# Patient Record
Sex: Male | Born: 1970 | Race: White | Hispanic: No | Marital: Married | Smoking: Former smoker
Health system: Southern US, Community
[De-identification: ages and names within clinical notes are randomized; demographics above are authoritative.]

## PROBLEM LIST (undated history)

## (undated) DIAGNOSIS — I10 Essential (primary) hypertension: Secondary | ICD-10-CM

## (undated) DIAGNOSIS — K5790 Diverticulosis of intestine, part unspecified, without perforation or abscess without bleeding: Secondary | ICD-10-CM

## (undated) DIAGNOSIS — E785 Hyperlipidemia, unspecified: Secondary | ICD-10-CM

## (undated) DIAGNOSIS — K219 Gastro-esophageal reflux disease without esophagitis: Secondary | ICD-10-CM

## (undated) DIAGNOSIS — G4733 Obstructive sleep apnea (adult) (pediatric): Secondary | ICD-10-CM

## (undated) DIAGNOSIS — R9430 Abnormal result of cardiovascular function study, unspecified: Secondary | ICD-10-CM

## (undated) DIAGNOSIS — E119 Type 2 diabetes mellitus without complications: Secondary | ICD-10-CM

## (undated) DIAGNOSIS — IMO0002 Reserved for concepts with insufficient information to code with codable children: Secondary | ICD-10-CM

## (undated) DIAGNOSIS — IMO0001 Reserved for inherently not codable concepts without codable children: Secondary | ICD-10-CM

## (undated) HISTORY — DX: Reserved for inherently not codable concepts without codable children: IMO0001

## (undated) HISTORY — DX: Hyperlipidemia, unspecified: E78.5

## (undated) HISTORY — DX: Reserved for concepts with insufficient information to code with codable children: IMO0002

## (undated) HISTORY — DX: Obstructive sleep apnea (adult) (pediatric): G47.33

## (undated) HISTORY — DX: Essential (primary) hypertension: I10

## (undated) HISTORY — DX: Gastro-esophageal reflux disease without esophagitis: K21.9

## (undated) HISTORY — DX: Abnormal result of cardiovascular function study, unspecified: R94.30

## (undated) HISTORY — DX: Type 2 diabetes mellitus without complications: E11.9

## (undated) HISTORY — DX: Diverticulosis of intestine, part unspecified, without perforation or abscess without bleeding: K57.90

---

## 1999-08-03 ENCOUNTER — Emergency Department (HOSPITAL_COMMUNITY): Admission: EM | Admit: 1999-08-03 | Discharge: 1999-08-03 | Payer: Self-pay | Admitting: Emergency Medicine

## 1999-08-03 ENCOUNTER — Encounter: Payer: Self-pay | Admitting: Emergency Medicine

## 1999-08-07 ENCOUNTER — Inpatient Hospital Stay (HOSPITAL_COMMUNITY): Admission: EM | Admit: 1999-08-07 | Discharge: 1999-08-09 | Payer: Self-pay | Admitting: Emergency Medicine

## 2002-10-08 ENCOUNTER — Emergency Department (HOSPITAL_COMMUNITY): Admission: EM | Admit: 2002-10-08 | Discharge: 2002-10-09 | Payer: Self-pay | Admitting: Emergency Medicine

## 2002-10-08 ENCOUNTER — Encounter: Payer: Self-pay | Admitting: Emergency Medicine

## 2003-07-23 ENCOUNTER — Inpatient Hospital Stay (HOSPITAL_COMMUNITY): Admission: EM | Admit: 2003-07-23 | Discharge: 2003-07-29 | Payer: Self-pay | Admitting: Internal Medicine

## 2004-02-12 ENCOUNTER — Encounter: Admission: RE | Admit: 2004-02-12 | Discharge: 2004-02-12 | Payer: Self-pay | Admitting: General Surgery

## 2004-02-20 ENCOUNTER — Ambulatory Visit (HOSPITAL_COMMUNITY): Admission: RE | Admit: 2004-02-20 | Discharge: 2004-02-20 | Payer: Self-pay | Admitting: Gastroenterology

## 2004-02-21 ENCOUNTER — Ambulatory Visit (HOSPITAL_COMMUNITY): Admission: RE | Admit: 2004-02-21 | Discharge: 2004-02-21 | Payer: Self-pay | Admitting: Gastroenterology

## 2004-05-28 ENCOUNTER — Inpatient Hospital Stay (HOSPITAL_COMMUNITY): Admission: EM | Admit: 2004-05-28 | Discharge: 2004-06-03 | Payer: Self-pay | Admitting: Gastroenterology

## 2004-08-25 ENCOUNTER — Ambulatory Visit: Payer: Self-pay | Admitting: Gastroenterology

## 2004-09-09 ENCOUNTER — Ambulatory Visit: Payer: Self-pay | Admitting: Family Medicine

## 2004-09-30 ENCOUNTER — Ambulatory Visit (HOSPITAL_COMMUNITY): Admission: RE | Admit: 2004-09-30 | Discharge: 2004-09-30 | Payer: Self-pay | Admitting: *Deleted

## 2004-11-03 ENCOUNTER — Ambulatory Visit (HOSPITAL_COMMUNITY): Admission: RE | Admit: 2004-11-03 | Discharge: 2004-11-03 | Payer: Self-pay | Admitting: *Deleted

## 2005-10-19 ENCOUNTER — Ambulatory Visit: Payer: Self-pay | Admitting: Family Medicine

## 2005-11-05 ENCOUNTER — Ambulatory Visit (HOSPITAL_COMMUNITY): Admission: RE | Admit: 2005-11-05 | Discharge: 2005-11-05 | Payer: Self-pay | Admitting: General Surgery

## 2005-11-05 HISTORY — PX: INGUINAL HERNIA REPAIR: SUR1180

## 2007-04-26 ENCOUNTER — Encounter: Admission: RE | Admit: 2007-04-26 | Discharge: 2007-04-26 | Payer: Self-pay | Admitting: Family Medicine

## 2007-06-17 ENCOUNTER — Encounter: Admission: RE | Admit: 2007-06-17 | Discharge: 2007-06-17 | Payer: Self-pay | Admitting: General Surgery

## 2007-07-28 ENCOUNTER — Encounter: Admission: RE | Admit: 2007-07-28 | Discharge: 2007-07-28 | Payer: Self-pay | Admitting: Family Medicine

## 2009-08-29 ENCOUNTER — Telehealth (INDEPENDENT_AMBULATORY_CARE_PROVIDER_SITE_OTHER): Payer: Self-pay | Admitting: *Deleted

## 2011-02-05 NOTE — Discharge Summary (Signed)
NAMEJERMINE, Jonathan Mcfarland NO.:  [id number]   MEDICAL RECORD NO.:  [id number]                   PATIENT TYPE:  INP   LOCATION:  0445                                 FACILITY:  [facility]   PHYSICIAN:  Barbette Hair. Jonathan Mcfarland, M.D. [facility]          DATE OF BIRTH:  11-Aug-1971   DATE OF ADMISSION:  05/28/2004  DATE OF DISCHARGE:  06/03/2004                                 DISCHARGE SUMMARY   ADMITTING DIAGNOSES:  31.  A 40 year old white male with progressive abdominal pain and diarrhea      with colonoscopy findings suggestive of Crohn's colitis, symptomatically      refractory to outpatient management.  2.  Gastroesophageal reflux disease.   DISCHARGE DIAGNOSES:  1.  Crohn's colitis refractory to outpatient management, symptomatically      improved.  2.  Leukocytosis felt secondary to steroids.  3.  Gastroesophageal reflux disease.   CONSULTATIONS:  None.   PROCEDURES:  CT scan of the abdomen and pelvis.   BRIEF HISTORY:  Jonathan Mcfarland is a pleasant 40 year old white male, who was  admitted on the day of colonoscopy for management of progressive abdominal  pain and diarrhea.  He initially had presented to Dr. Arlyce Mcfarland in November  2004 with similar but less severe symptoms.  It was thought at that time  that he had diverticulitis.  CT scan showed some low-grade diverticulitis  and stranding in the sigmoid colon.  He was then seen after that but  persisted with abdominal pain and started having some hematochezia.  He had  undergo colonoscopy, showed some erythema of the left colon, and biopsies  were done and were negative.  He was placed on Asacol 800 mg t.i.d.  He was  reevaluated in June 2005, again with persistent abdominal pain and diarrhea.  Colonoscopy was repeated showing erythema and edema in the left colon but  negative biopsies.  He was continued on Asacol and started on hydrocortisone  enemas.  He then had upper GI and small-bowel follow-through which was  unremarkable.  He continued to be symptomatic and was started on a trial of  prednisone, as it was felt that he likely did have inflammatory bowel  disease, though unable to prove on biopsies.  His diarrhea initially  improved on prednisone but on lowering his dose, his symptoms returned.  He  has had a difficult time since that and recently has been on increasing  doses of prednisone up to 40 per day.  His Asacol has been increased to 1200  mg t.i.d.  He underwent repeat colonoscopy on the day of admission which  showed edema of the left colon and mild erythema.  He had three discrete  ulcers noted in the rectosigmoid junction, and it was felt that his findings  were consistent with Crohn's.  Because he was refractory to outpatient  management at this time, he is admitted for bowel rest and IV steroids.   LABORATORY STUDIES  ON ADMISSION:  IBD markers were sent and were pending at  the time of dictation.  WBC 11.5, hemoglobin 14.6, hematocrit 43, MCV 84,  sed rate 9, pro time 12.2, INR 0.9; electrolytes were within normal limits,  albumin 3.5.  Liver function studies normal with the exception of an SGPT of  56.  Repeat CBC on September 12 showed a white count of 19.1 with no  significant shift, hemoglobin 16.4, hematocrit 49.1.   CT scan of the abdomen and pelvis on September 9, unremarkable CT of the  abdomen.  Pelvis showed wall thickening of the sigmoid colon, more prominent  then on his prior CT and subtle pericolonic stranding around the involved  segments.  There was no evidence of free air or abscess.  Appendix was  unremarkable.  His terminal ileum was decompressed but no evidence of  inflammatory change noted.   HOSPITAL COURSE:  The patient was admitted to the service of Dr. Melvia Heaps.  He was initially placed on bowel rest, started on IV Solu-Medrol at  30 q.12h., given Phenergan and Demerol as needed for pain control and  nausea.  He was also placed on IV Protonix, p.o.  Bentyl, and we started him  on Imuran at 75 mg p.o. daily, as he has been somewhat refractory to oral  steroids.  He underwent CT scan of the abdomen and pelvis on the 9th with  findings as outlined above.  We gradually advanced his diet, continued him  on IV Solu-Medrol, and he continued to require IV narcotics for several  days.  He did gradually improve, and we were able to convert him to oral  pain medication and get his steroids converted to oral form as well.  His  diarrhea decreased significantly, and actually he did not really have any  bowel movements for a couple of days prior to discharge.  He did not have  any ongoing rectal bleeding after the first 24 hours either.  He was felt  stable for discharge on September 14 with instructions to follow up with Dr.  Arlyce Mcfarland in the office on September 19 and at that time if he is doing well,  will be released back to work.   DISCHARGE MEDICATIONS:  1.  Asacol 400 mg 4 p.o. t.i.d.  2.  Prednisone 60 mg p.o. q.a.m.  3.  Protonix 40 mg daily.  4.  Tranxene 7.5 b.i.d. to t.i.d. p.r.n.  5.  Mepergan Fortis 1 p.o. q.6h. p.r.n. pain.  6.  He was asked to try to use Extra-Strength Tylenol for less severe pain.  7.  Imuran 75 mg daily.   DIET:  Low residue.   We will need to follow up on his IBD markers; his biopsies from recent  colonoscopy do not show any active inflammation.  Also, he will need follow  up liver function studies and CBC because of initiating Imuran during his  hospitalization.      AE/MEDQ  D:  06/05/2004  T:  06/06/2004  Job:  [id number]

## 2011-02-05 NOTE — Op Note (Signed)
NAMERAYANE, GALLARDO NO.:  [id number]   MEDICAL RECORD NO.:  [id number]          PATIENT TYPE:  AMB   LOCATION:  DAY                          FACILITY:  [facility]   PHYSICIAN:  Timothy E. Earlene Plater, M.D. DATE OF BIRTH:  1970/12/22   DATE OF PROCEDURE:  11/05/2005  DATE OF DISCHARGE:                                 OPERATIVE REPORT   PREOPERATIVE DIAGNOSIS:  Left inguinal hernia.   POSTOPERATIVE DIAGNOSIS:  Direct left inguinal hernia.   OPERATIVE PROCEDURE:  Repair of left direct inguinal hernia with mesh.   SURGEON:  Timothy E. Earlene Plater, M.D.   ANESTHESIA:  General.   Mr. Jonathan Mcfarland is well known to me, with the resolution of other GI issues.  A  symptomatic left inguinal hernia is present, documented, and he wishes to  have a repair, and he does agree and understand.  The area was marked and  the permit signed.   The patient was taken to the operating room and placed supine.  General  endotracheal anesthesia administered.  The groin was clipped and prepped and  draped in the usual fashion.  Generous 0.25% Marcaine with epinephrine was  used throughout for prolonged local anesthesia.  Short curvilinear incision  made in a skin crease in the left groin, the abundant subcutaneous tissue  dissected, and bleeding points cauterized, and the external oblique  identified and opened in line with its fibers to the external ring.  Cord  structures were small and normal.  They were dissected from the floor of the  canal which was completely herniated.  All structures were dissected out.  The ilioinguinal nerve was identified and spared.  A piece of ULTRAPRO mesh  was cut and fashioned to fit the floor of the canal and sewn down to the  surrounding normal fascia with 2-0 Prolene up to and around the internal  ring and the cord structures exited normally.  The repair was satisfactory,  and hernia contents were reduced, and again the cord was normal.  The  external oblique was  closed with a running 2-0 Vicryl, deep subcutaneous  with 2-0 Vicryl, and skin with 3-0 Monocryl.  Counts were correct.  Steri-  Strips applied.  He was removed to the recovery room in good condition.   Written and verbal instructions including Percocet were given.  He will be  seen in followup in the office.      Timothy E. Earlene Plater, M.D.  Electronically Signed     TED/MEDQ  D:  11/05/2005  T:  11/05/2005  Job:  [id number]

## 2011-02-05 NOTE — H&P (Signed)
NAMEHEWITT, GARNER NO.:  [id number]   MEDICAL RECORD NO.:  [id number]                   PATIENT TYPE:  INP   LOCATION:  0445                                 FACILITY:  [facility]   PHYSICIAN:  Barbette Hair. Arlyce Dice, M.D. [facility]          DATE OF BIRTH:  1970-11-18   DATE OF ADMISSION:  05/28/2004  DATE OF DISCHARGE:                                HISTORY & PHYSICAL   PROBLEM:  Abdominal pain and diarrhea.   HISTORY OF PRESENT ILLNESS:  The patient is a 40 year old white male  admitted for diarrhea and abdominal pain.  He presented in November of 2004  with a similar presentation.  It was thought that he had acute  diverticulosis.  CT demonstrated diverticulosis and straining in the sigmoid  colon.  He was subsequently seen as an outpatient for persistent abdominal  pain, diarrhea, and bleeding.  He underwent a colonoscopy in November of  2004 that demonstrated some erythema of the left colon.  Biopsies were  negative.  He was nonetheless placed on Asacol 800 mg t.i.d.  He was  reevaluated in June for persistent abdominal pain and diarrhea.  Colonoscopy  at that time demonstrated erythema and edema of the left colon.  Biopsies  again were negative.  He was maintained on Asacol and was given  hydrocortisone enemas.  An upper GI and small bowel follow through was  performed in June of 2005 that was entirely unremarkable.  In late June, he  was placed on prednisone 20 mg a day.  Diarrhea subsided.  Upon lowering his  prednisone from 20 to 15 mg he had recurrent diarrhea and was increased to  20 mg.  Prednisone was slowly lowered.  Again in August, he had recurrence  of his pain and diarrhea.  Over the last two to three weeks, he has had  persistent symptoms despite 40 mg of prednisone.  Asacol was increased to  1500 mg t.i.d.  Today, he underwent repeat colonoscopy which demonstrated  edema of the left colon with very mild erythema.  Three discrete ulcers were  seen  in the rectosigmoid junction.  Findings are compatible with Crohn's  disease.  He is admitted for bowel rest and steroids.   PAST MEDICAL HISTORY:  Unremarkable.   MEDICATIONS:  1.  Protonix 40 mg a day.  2.  Prednisone 40 mg daily.  3.  Asacol 1600 mg t.i.d.   ALLERGIES:  FLAGYL, CIPRO, MORPHINE.   PHYSICAL EXAMINATION:  GENERAL:  He is a well-developed, well-nourished,  male.  VITAL SIGNS:  Pulse 74, blood pressure 140/98, weight 218.  HEENT:  Head is within normal limits.  CHEST:  Clear.  There are no cardiac murmurs, rubs, or gallops.  ABDOMEN:  Without mass, tenderness, or organomegaly.  RECTAL:  Deferred.   IMPRESSION:  Abdominal pain and diarrhea.  Colonoscopy findings are very  suggestive of Crohn's colitis.   RECOMMENDATIONS:  1.  Bowel rest.  2.  Solu-Medrol 30 mg b.i.d.  3.  Continue Asacol 1600 mg t.i.d.  4.  CT of the abdomen and pelvis.                                                Barbette Hair. Arlyce Dice, M.D. [facility]    RDK/MEDQ  D:  05/28/2004  T:  05/28/2004  Job:  [id number]   cc:   Sheppard Plumber. Earlene Plater, M.D.  [address] [place]  [place] [address]  Fax: [phone]

## 2011-02-05 NOTE — Discharge Summary (Signed)
NAMEZAYAN, DELVECCHIO NO.:  [id number]   MEDICAL RECORD NO.:  [id number]                   PATIENT TYPE:  INP   LOCATION:  0344                                 FACILITY:  [facility]   PHYSICIAN:  Rene Paci, M.D. [facility]          DATE OF BIRTH:  1970-11-08   DATE OF ADMISSION:  07/23/2003  DATE OF DISCHARGE:  07/29/2003                                 DISCHARGE SUMMARY   DISCHARGE DIAGNOSES:  1. Abdominal pain and diarrhea secondary to diverticulitis.  Continue oral     antibiotics.  Outpatient follow-up with GI, Barbette Hair. Arlyce Dice, M.D. [facility]     November 29 at 9:30 to schedule for colonoscopy.  2. Chronic diarrhea exacerbation secondary to above.  Work-up with     outpatient as described.  3. Allergic reaction to Cipro, Flagyl, and/or morphine with itching,     resolved with discontinuation of these medications.   DISCHARGE MEDICATIONS:  1. Augmentin 875 mg p.o. b.i.d. x10 additional days.  2. Robinul one p.o. q.a.m. p.r.n. abdominal cramping.  3. Ibuprofen p.r.n. pain.   FOLLOWUP:  Hospital follow-up with GI, Barbette Hair. Arlyce Dice, M.D. [facility] as  described above November 29 at 9:30 a.m. to schedule for colonoscopy to  evaluate chronic diarrhea.  Also follow up with Laurita Quint, M.D.,  primary care physician as needed.   HOSPITAL COURSE:  Problem 1 - ABDOMINAL PAIN WITH DIARRHEA SECONDARY TO  DIVERTICULITIS:  The patient is a pleasant 40 year old gentleman who  presented to his primary care physician's office day of admission secondary  to intense abdominal pain that had awoken him from sleep and was associated  with an exacerbation of his chronic diarrhea.  Due to concerns for acute  abdomen he was referred for admission.  CT of the abdomen and pelvis was  performed and showed diverticulosis of the sigmoid colon with stranding  compatible with diverticulitis.  No evidence of abscess, free fluid.  Normal  appendix.  Therefore, he was initiated on IV  antibiotics Flagyl and Cipro,  though he developed itching on these medications.  He was changed then to  Unasyn for a day and then tolerated p.o. Augmentin without difficulty.  The  itching resolved with discontinuation of these medications as well as the  morphine which he had been given for pain.  His white count has returned to  normal prior to discharge and his stooling is less than b.i.d.  Outpatient  follow-up as described above with GI for evaluation of frequent stooling.                                               Rene Paci, M.D. [facility]    VL/MEDQ  D:  08/09/2003  T:  08/09/2003  Job:  [id number]

## 2011-02-28 ENCOUNTER — Encounter: Payer: Self-pay | Admitting: Physician Assistant

## 2011-04-19 ENCOUNTER — Encounter: Payer: Self-pay | Admitting: Physician Assistant

## 2011-04-20 ENCOUNTER — Encounter: Payer: Self-pay | Admitting: Physician Assistant

## 2011-04-20 ENCOUNTER — Ambulatory Visit (INDEPENDENT_AMBULATORY_CARE_PROVIDER_SITE_OTHER): Payer: 59 | Admitting: Physician Assistant

## 2011-04-20 ENCOUNTER — Ambulatory Visit (HOSPITAL_COMMUNITY): Payer: 59 | Attending: Physician Assistant | Admitting: Radiology

## 2011-04-20 DIAGNOSIS — I10 Essential (primary) hypertension: Secondary | ICD-10-CM | POA: Insufficient documentation

## 2011-04-20 DIAGNOSIS — I509 Heart failure, unspecified: Secondary | ICD-10-CM | POA: Insufficient documentation

## 2011-04-20 DIAGNOSIS — I4891 Unspecified atrial fibrillation: Secondary | ICD-10-CM | POA: Insufficient documentation

## 2011-04-20 DIAGNOSIS — R079 Chest pain, unspecified: Secondary | ICD-10-CM

## 2011-04-20 NOTE — Progress Notes (Signed)
Exercise Treadmill Test  Pre-Exercise Testing Evaluation Rhythm: normal sinus  Rate: 76   PR:  .15 QRS:  .09  QT:  .35 QTc: .40     Test  Exercise Tolerance Test Ordering MD: Burnell Blanks M.D  Interpreting MD:  Tereso Newcomer PA-C  Unique Test No: 1  Treadmill:  1  Indication for ETT: chest pain - rule out ischemia  Contraindication to ETT: No   Stress Modality: exercise - treadmill  Cardiac Imaging Performed: non   Protocol: standard Bruce - submaximal  Max BP: 178/82  Max MPHR (bpm):  178 85% MPR (bpm):  151  MPHR obtained (bpm):  120 % MPHR obtained:  67  Reached 85% MPHR (min:sec):   Total Exercise Time (min-sec):  2:20  Workload in METS:  5.0 Borg Scale:   Reason ETT Terminated:  patient's desire to stop; Dyspnea    ST Segment Analysis At Rest: normal ST segments - no evidence of significant ST depression With Exercise: no evidence of significant ST depression  Other Information Arrhythmia:  No Angina during ETT:  absent (0) Quality of ETT:  non-diagnostic  ETT Interpretation:  normal - no evidence of ischemia by ST analysis  Comments: Poor exercise tolerance. Patient stopped due to significant dyspnea and near syncope. O2 sats checked after he came off treadmill and it was 94%, then 98%. He denied chest pain. BP did increase with exercise. No ST-T changes to suggest ischemia at submaximal exercise.  Recommendations: Patient has significant CRFs of HTN, HLP and prior smoking.  His maternal uncle (who is his adoptive father) had an MI in his 63s.  He is unsure of the rest of his history. He has had exertional chest pain and dyspnea for the past 2 months.   I discussed his case today with Dr. Ladona Ridgel and reviewed his stress test. I will set him up for an echo today to evaluate his LV function. I will also set him up for a Lexiscan Myoview this week. He will be brought back for consultation with one of our cardiologists in the next week after the above testing is  complete to make further recommendations.

## 2011-04-20 NOTE — Progress Notes (Deleted)
Exercise Treadmill Test  Pre-Exercise Testing Evaluation Rhythm: normal sinus  Rate: 76   PR:  .15 QRS:  .09  QT:  .35 QTc: .40     Test  Exercise Tolerance Test Ordering MD: Burnell Blanks M.D  Interpreting MD:  Tereso Newcomer PA-C  Unique Test No: 1  Treadmill:  1  Indication for ETT: chest pain - rule out ischemia  Contraindication to ETT: No   Stress Modality: exercise - treadmill  Cardiac Imaging Performed: non   Protocol: standard Bruce - submaximal  Max BP:  ***/***  Max MPHR (bpm):  178 85% MPR (bpm):  151  MPHR obtained (bpm):  *** % MPHR obtained:  ***  Reached 85% MPHR (min:sec):  *** Total Exercise Time (min-sec):  ***  Workload in METS:  *** Borg Scale: ***  Reason ETT Terminated:  {CHL REASON TERMINATED FOR ETT:21021064}    ST Segment Analysis At Rest: {CHL ST SEGMENT AT REST FOR ZOX:09604540} With Exercise: {CHL ST SEGMENT WITH EXERCISE FOR JWJ:19147829}  Other Information Arrhythmia:  {CHL ARRHYTHMIA FOR FAO:13086578} Angina during ETT:  {CHL ANGINA DURING ION:62952841} Quality of ETT:  {CHL QUALITY OF LKG:40102725}  ETT Interpretation:  {CHL INTERPRETATION FOR DGU:44034742}  Comments: ***  Recommendations: ***

## 2011-04-20 NOTE — Patient Instructions (Signed)
Your physician has requested that you have a lexiscan myoview DX [phone], AS PER SCOTT WEAVER, PA-C THIS NEEDS TO BE DONE THIS WEEK. For further information please visit [email or web address] Please follow instruction sheet, as given.  Your physician recommends that you schedule a follow-up appointment in: PLEASE MAKE APPOINTMENT WITH THE DOD NEXT WEEK, THIS IS ALSO AS PER SCOTT WEAVER, PA-C.

## 2011-04-21 ENCOUNTER — Encounter: Payer: Self-pay | Admitting: *Deleted

## 2011-04-22 ENCOUNTER — Ambulatory Visit (HOSPITAL_COMMUNITY): Payer: 59 | Attending: Cardiology | Admitting: Radiology

## 2011-04-22 DIAGNOSIS — R079 Chest pain, unspecified: Secondary | ICD-10-CM

## 2011-04-22 MED ORDER — TECHNETIUM TC 99M TETROFOSMIN IV KIT
33.0000 | PACK | Freq: Once | INTRAVENOUS | Status: AC | PRN
  Administered 2011-04-22: 33 via INTRAVENOUS

## 2011-04-22 MED ORDER — REGADENOSON 0.4 MG/5ML IV SOLN
0.4000 mg | Freq: Once | INTRAVENOUS | Status: AC
  Administered 2011-04-22: 0.4 mg via INTRAVENOUS

## 2011-04-22 MED ORDER — TECHNETIUM TC 99M TETROFOSMIN IV KIT
11.0000 | PACK | Freq: Once | INTRAVENOUS | Status: AC | PRN
  Administered 2011-04-22: 11 via INTRAVENOUS

## 2011-04-22 NOTE — Progress Notes (Addendum)
[facility] SITE 3 NUCLEAR MED [address] [place] [address] [phone]  Cardiology Nuclear Med Jonathan Mcfarland is a 40 y.o. male [id number] Oct 03, 1970   Nuclear Med Background Indication for Stress Test:  Evaluation for Ischemia History:  04/20/11 Echo:EF=55-60%; 04/20/11 WNU:[id number]; no ST changes; h/o Atrial Fibrillation. Cardiac Risk Factors: Family History - CAD, History of Smoking, Hypertension, Lipids and Obesity  Symptoms:  Chest Pain with and without Exertion (last episode of chest discomfort was yesterday), Diaphoresis, DOE, Nausea and Vomiting   Nuclear Pre-Procedure Caffeine/Decaff Intake:  None NPO After: 8:30pm   Lungs:  Clear.  O2 sat 96% on RA. IV 0.9% NS with Angio Cath:  20g  IV Site: R Antecubital  IV Started by:  Bonnita Levan, RN  Chest Size (in):  44 Cup Size: n/a  Height: 5\' 10"  (1.778 m)  Weight:  242 lb (109.77 kg)  BMI:  Body mass index is 34.72 kg/(m^2). Tech Comments:  N/A    Nuclear Med Study 1 or 2 day study: 1 day  Stress Test Type:  Treadmill/Lexiscan  Reading MD: Olga Millers, MD  Order Authorizing Provider:  Tereso Newcomer, PA; Willa Rough, MD  Resting Radionuclide: Technetium 64m Tetrofosmin  Resting Radionuclide Dose: 11.0 mCi   Stress Radionuclide:  Technetium 38m Tetrofosmin  Stress Radionuclide Dose: 33.0 mCi           Stress Protocol Rest HR: 56 Stress HR: 107  Rest BP: 112/74 Stress BP: 152/77  Exercise Time (min): 2:00 METS: n/a   Predicted Max HR: 178 bpm % Max HR: 60.11 bpm Rate Pressure Product: 34742   Dose of Adenosine (mg):  n/a Dose of Lexiscan: 0.4 mg  Dose of Atropine (mg): n/a Dose of Dobutamine: n/a mcg/kg/min (at max HR)  Stress Test Technologist: Smiley Houseman, CMA-N  Nuclear Technologist:  Doyne Keel, CNMT     Rest Procedure:  Myocardial perfusion imaging was performed at rest 45 minutes following the intravenous administration of Technetium 19m Tetrofosmin.  Rest ECG: No  acute changes.  Stress Procedure:  The patient received IV Lexiscan 0.4 mg over 15-seconds with concurrent low level exercise and then Technetium 58m Tetrofosmin was injected at 30-seconds while the patient continued walking one more minute.  There were nonspecific T-wave changes with Lexiscan.  Quantitative spect images were obtained after a 45-minute delay.  Stress ECG: No significant ST segment change suggestive of ischemia.  QPS Raw Data Images:  Acquisition technically good; normal left ventricular size. Stress Images:  There is decreased uptake in the anterior wall. Rest Images:  Normal homogeneous uptake in all areas of the myocardium. Subtraction (SDS):  These findings are consistent with mild anterior ischemia. Transient Ischemic Dilatation (Normal <1.22):  1.10 Lung/Heart Ratio (Normal <0.45):  0.31  Quantitative Gated Spect Images QGS EDV:  107 ml QGS ESV:  48 ml QGS cine images:  NL LV Function; NL Wall Motion QGS EF: 55%  Impression Exercise Capacity:  Lexiscan with low level exercise. BP Response:  Normal blood pressure response. Clinical Symptoms:  No chest pain. ECG Impression:  No significant ST segment change suggestive of ischemia. Comparison with Prior Nuclear Study: No previous nuclear study performed  Overall Impression:  Abnormal stress nuclear study.   Olga Millers     04/23/11 Make sure patient knows about appt next week Make sure he is taking ASA If he has any worrisome symptoms over the weekend he should go to the ED Tereso Newcomer, PA-C

## 2011-04-23 NOTE — Progress Notes (Signed)
Mild anterior ischemia noted, per Dr. Jens Som. Nuclear report routed to [facility] and Dr. Narda Rutherford pt. appt. on 04/30/11). Maelin Kurkowski, Farris Has

## 2011-04-30 ENCOUNTER — Ambulatory Visit (INDEPENDENT_AMBULATORY_CARE_PROVIDER_SITE_OTHER): Payer: 59 | Admitting: Cardiology

## 2011-04-30 ENCOUNTER — Other Ambulatory Visit: Payer: Self-pay | Admitting: *Deleted

## 2011-04-30 ENCOUNTER — Encounter: Payer: Self-pay | Admitting: *Deleted

## 2011-04-30 DIAGNOSIS — Z01810 Encounter for preprocedural cardiovascular examination: Secondary | ICD-10-CM

## 2011-04-30 DIAGNOSIS — I10 Essential (primary) hypertension: Secondary | ICD-10-CM

## 2011-04-30 DIAGNOSIS — E785 Hyperlipidemia, unspecified: Secondary | ICD-10-CM | POA: Insufficient documentation

## 2011-04-30 DIAGNOSIS — J45909 Unspecified asthma, uncomplicated: Secondary | ICD-10-CM | POA: Insufficient documentation

## 2011-04-30 DIAGNOSIS — R079 Chest pain, unspecified: Secondary | ICD-10-CM | POA: Insufficient documentation

## 2011-04-30 DIAGNOSIS — R9430 Abnormal result of cardiovascular function study, unspecified: Secondary | ICD-10-CM | POA: Insufficient documentation

## 2011-04-30 DIAGNOSIS — K219 Gastro-esophageal reflux disease without esophagitis: Secondary | ICD-10-CM | POA: Insufficient documentation

## 2011-04-30 NOTE — Assessment & Plan Note (Signed)
At this point we need to workup the patient's chest pain further.  His nuclear study does suggest mild anterior ischemia.  He has risk factors for coronary disease.  I've had a very careful discussion with the patient and his wife about cardiac catheterization including risks and benefits.  He wants to proceed and this will be arranged.  At this point it is difficult to know for sure if his pain is from cardiac disease.  Catheterization will help determine this.

## 2011-04-30 NOTE — Patient Instructions (Addendum)
Your physician has requested that you have a cardiac catheterization. Cardiac catheterization is used to diagnose and/or treat various heart conditions. Doctors may recommend this procedure for a number of different reasons. The most common reason is to evaluate chest pain. Chest pain can be a symptom of coronary artery disease (CAD), and cardiac catheterization can show whether plaque is narrowing or blocking your heart's arteries. This procedure is also used to evaluate the valves, as well as measure the blood flow and oxygen levels in different parts of your heart. For further information please visit [email or web address] Please follow instruction sheet, as given.on 05/11/11. Instructions will on Monday 05/10/11 when you come for labs. Your physician recommends that you return for lab work in: [place], [facility] on 05/10/11. Ask for Hardin Negus for Pre-cath instructions when you come.Marland Kitchen

## 2011-04-30 NOTE — Assessment & Plan Note (Signed)
Blood pressure is being controlled.  No change in therapy.

## 2011-04-30 NOTE — Progress Notes (Signed)
HPI The patient is seen today as a new patient evaluation for the evaluation of chest discomfort.  Patient has a family history of coronary disease.  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  He has been having chest discomfort.  Recently he has had chest pain at various times.  He is not having classic exertional pain.  He did awake one evening with chest discomfort and shortness of breath and took him to the emergency room.  He was stable with this and was discharged with plans for outpatient followup.  He had a treadmill which showed some ST changes.  Therefore echo and nuclear stress were arranged.  The echo revealed an ejection fraction of 55-60%.  The nuclear scan was done with low-level exercise and a pharmacologic agent.  The study was read as showing some increased uptake in the anterior wall.  It was read as consistent with mild anterior ischemia.  Ejection fraction was 55%.   Today the patient is here with his wife.  He is stable.  Allergies  Allergen Reactions  . Ciprofloxacin   . Percocet (Oxycodone-Acetaminophen)     Current Outpatient Prescriptions  Medication Sig Dispense Refill  . DOXAZOSIN MESYLATE PO Take 1 tablet by mouth daily.        . fluocinonide (LIDEX) 0.05 % external solution as needed.      Marland Kitchen HYDROcodone-acetaminophen (VICODIN) 5-500 MG per tablet as directed.      . lovastatin (MEVACOR) 40 MG tablet Take 1 tablet by mouth Daily.      . metroNIDAZOLE (FLAGYL) 500 MG tablet as directed.      . sulfamethoxazole-trimethoprim (BACTRIM DS) 800-160 MG per tablet as directed.        History   Social History  . Marital Status: Married    Spouse Name: N/A    Number of Children: 2  . 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 Smoking status: Former Games developer  . Smokeless tobacco: Not on file  . Alcohol Use: Yes     occasionally  . Drug Use:  No  . Sexually Active: Not on file   Other Topics Concern  . Not on file   Social History Narrative  . No narrative on file    Family History  Problem Relation Age of Onset  . Adopted: Yes  . Heart attack Maternal Uncle 70  . Cancer Mother   . Heart disease Mother     Past Medical History  Diagnosis Date  . Chest pain   . Hypertension   . Hyperlipidemia   . Diverticulosis   . GERD (gastroesophageal reflux disease)   . Asthma   . Reflux     Past Surgical History  Procedure Date  . Inguinal hernia repair 11/05/05    ROS  Patient denies fever, chills, headache, sweats, rash, change in vision, change in hearing, cough, nausea vomiting, urinary symptoms.  All other systems are reviewed and are negative.  PHYSICAL EXAM Patient is stable.  He is oriented to person time and place.  Affect is normal.  He is here with his wife.  Head is atraumatic.  There is no xanthelasma.  There is no jugular venous distention.  There are no carotid bruits.  Lungs are clear.  Respiratory effort is not labored.  Cardiac exam reveals an S1-S2.  There are no clicks or significant murmurs.  The abdomen is soft.  There is no peripheral edema.  There no musculoskeletal deformities.  There no skin rashes.  The patient is well tanned. Filed Vitals:   04/30/11 1556  Height: 5\' 10"  (1.778 m)  Weight: 238 lb (107.956 kg)    EKG Is not done today because of multiple EKGs that have been done on prior treadmills and nuclear studies.  ASSESSMENT & PLAN

## 2011-05-03 ENCOUNTER — Encounter: Payer: Self-pay | Admitting: *Deleted

## 2011-05-06 ENCOUNTER — Encounter: Payer: Self-pay | Admitting: *Deleted

## 2011-05-10 ENCOUNTER — Other Ambulatory Visit (INDEPENDENT_AMBULATORY_CARE_PROVIDER_SITE_OTHER): Payer: 59 | Admitting: *Deleted

## 2011-05-10 DIAGNOSIS — Z01810 Encounter for preprocedural cardiovascular examination: Secondary | ICD-10-CM

## 2011-05-10 DIAGNOSIS — I10 Essential (primary) hypertension: Secondary | ICD-10-CM

## 2011-05-10 DIAGNOSIS — R079 Chest pain, unspecified: Secondary | ICD-10-CM

## 2011-05-10 LAB — PROTIME-INR: Prothrombin Time: 11.9 s (ref 10.2–12.4)

## 2011-05-10 LAB — CBC WITH DIFFERENTIAL/PLATELET
Basophils Relative: 0.4 % (ref 0.0–3.0)
Eosinophils Absolute: 0.6 10*3/uL (ref 0.0–0.7)
Eosinophils Relative: 5.6 % — ABNORMAL HIGH (ref 0.0–5.0)
Lymphs Abs: 2.5 10*3/uL (ref 0.7–4.0)
MCHC: 33.3 g/dL (ref 30.0–36.0)
MCV: 83.4 fl (ref 78.0–100.0)
Neutro Abs: 6.9 10*3/uL (ref 1.4–7.7)
Platelets: 228 10*3/uL (ref 150.0–400.0)
RBC: 5.28 Mil/uL (ref 4.22–5.81)

## 2011-05-10 LAB — BASIC METABOLIC PANEL
BUN: 14 mg/dL (ref 6–23)
Calcium: 8.9 mg/dL (ref 8.4–10.5)
Chloride: 102 mEq/L (ref 96–112)
Creatinine, Ser: 1 mg/dL (ref 0.4–1.5)

## 2011-05-11 ENCOUNTER — Inpatient Hospital Stay (HOSPITAL_BASED_OUTPATIENT_CLINIC_OR_DEPARTMENT_OTHER)
Admission: RE | Admit: 2011-05-11 | Discharge: 2011-05-11 | Disposition: A | Discharge status: Home / Self Care | Payer: 59 | Source: Ambulatory Visit | Attending: Cardiology | Admitting: Cardiology

## 2011-05-11 DIAGNOSIS — R9430 Abnormal result of cardiovascular function study, unspecified: Secondary | ICD-10-CM

## 2011-05-11 DIAGNOSIS — R0609 Other forms of dyspnea: Secondary | ICD-10-CM | POA: Insufficient documentation

## 2011-05-11 DIAGNOSIS — R0989 Other specified symptoms and signs involving the circulatory and respiratory systems: Secondary | ICD-10-CM | POA: Insufficient documentation

## 2011-05-11 DIAGNOSIS — R9439 Abnormal result of other cardiovascular function study: Secondary | ICD-10-CM | POA: Insufficient documentation

## 2011-05-11 DIAGNOSIS — R0789 Other chest pain: Secondary | ICD-10-CM | POA: Insufficient documentation

## 2011-05-26 ENCOUNTER — Encounter: Payer: Self-pay | Admitting: Cardiology

## 2011-05-27 ENCOUNTER — Ambulatory Visit (INDEPENDENT_AMBULATORY_CARE_PROVIDER_SITE_OTHER): Payer: 59 | Admitting: Cardiology

## 2011-05-27 ENCOUNTER — Encounter: Payer: Self-pay | Admitting: Cardiology

## 2011-05-27 DIAGNOSIS — R079 Chest pain, unspecified: Secondary | ICD-10-CM

## 2011-05-27 DIAGNOSIS — I10 Essential (primary) hypertension: Secondary | ICD-10-CM

## 2011-05-27 NOTE — Assessment & Plan Note (Signed)
Blood pressure control today. No change in therapy. 

## 2011-05-27 NOTE — Patient Instructions (Signed)
Your physician recommends that you schedule a follow-up appointment in: as needed  

## 2011-05-27 NOTE — Progress Notes (Signed)
HPI Patient is seen in follow of his heart catheterization.  I saw him last on April 30 2011.  His nuclear exercise study had shown a question of mild anterior ischemia.  Ejection fraction 55%.  Decision was made to proceed with catheterization.  This study was done May 11, 2011.  LV function was normal.  Ejection fraction was 55%, there was no significant coronary disease.  Patient returns for followup today. Allergies  Allergen Reactions  . Ciprofloxacin   . Percocet (Oxycodone-Acetaminophen)     Current Outpatient Prescriptions  Medication Sig Dispense Refill  . DOXAZOSIN MESYLATE PO Take 1 tablet by mouth daily.        . fluocinonide (LIDEX) 0.05 % external solution as needed.      Marland Kitchen HYDROcodone-acetaminophen (VICODIN) 5-500 MG per tablet as needed.       . lovastatin (MEVACOR) 40 MG tablet Take 1 tablet by mouth Daily.      . metroNIDAZOLE (FLAGYL) 500 MG tablet as directed.      . sulfamethoxazole-trimethoprim (BACTRIM DS) 800-160 MG per tablet as needed.         History   Social History  . Marital Status: Married    Spouse Name: N/A    Number of Children: 2  . 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 Smoking status: Former Games developer  . Smokeless tobacco: Not on file  . Alcohol Use: Yes     occasionally  . Drug Use: No  . Sexually Active: Not on file   Other Topics Concern  . Not on file   Social History Narrative  . No narrative on file    Family History  Problem Relation Age of Onset  . Adopted: Yes  . Heart attack Maternal Uncle 70  . Cancer Mother   . Heart disease Mother     Past Medical History  Diagnosis Date  . Chest pain     Abnormal treadmill, 2012  /  nuclear April 22, 2011 mild anterior ischemia  . Hypertension   . Hyperlipidemia   . Diverticulosis   . GERD (gastroesophageal reflux disease)   . Asthma   . Reflux   . Ejection fraction     EF 55-60%, echo, July, 2012    Past  Surgical History  Procedure Date  . Inguinal hernia repair 11/05/05    ROS  Patient denies fever, chills, headache, sweats, rash, change in vision, change in hearing, cough, nausea vomiting, urinary symptoms.  All other systems are reviewed and are negative.  PHYSICAL EXAM Patient is stable.  His catheter site is stable.  Head is atraumatic.  There is no jugular venous attention lungs are clear.  Respiratory effort is unlabored.  Cardiac reveals S1-S2.  No clicks or significant murmurs.  Abdomen soft is no peripheral edema. Filed Vitals:   05/27/11 1413  BP: 113/76  Pulse: 97  Resp: 14  Height: 5\' 10"  (1.778 m)  Weight: 245 lb (111.131 kg)    EKG Not done today  ASSESSMENT & PLAN

## 2011-05-27 NOTE — Assessment & Plan Note (Addendum)
The patient's discomfort is not cardiac.  He may have GERD.  I talked to him about medications and his eating habits. Because he has several risk factors for coronary disease I suggested that he take 81 mg of aspirin daily as long as it does not appear to cause any GI problems.

## 2011-06-05 NOTE — Cardiovascular Report (Signed)
  Jonathan Mcfarland, Jonathan Mcfarland NO.:  [id number]  MEDICAL RECORD NO.:  [id number]  LOCATION:  [id number]                     FACILITY:  [facility]  PHYSICIAN:  Marca Ancona, MD      DATE OF BIRTH:  1971/07/29  DATE OF PROCEDURE:  05/11/2011 DATE OF DISCHARGE:  04/22/2011                           CARDIAC CATHETERIZATION   PROCEDURE: 1. Left heart catheterization. 2. Coronary angiography. 3. Left ventriculography.  INDICATIONS:  This is a 40 year old with a history of chest pain as well as exertional dyspnea who had an abnormal Myoview and was set up for left heart catheterization by Dr. Myrtis Ser.  PROCEDURE NOTE:  After informed consent was obtained, the right groin was sterilely prepped and draped.  Lidocaine 1% was used to locally anesthetize the right groin area.  The right common femoral artery was entered using modified Seldinger technique and a 4-French arterial sheath was placed.  The left coronary artery was otherwise engaged using the JL-4 catheter, the right coronary artery was engaged using 3-DRC catheter.  Left ventricle was entered using angled pigtail catheter. There were no complications.  FINDINGS: 1. Hemodynamics:  LV 104/19, aorta 104/69. 2. Left ventriculography:  EF was estimated to be 55%.  There were no     regional wall motion abnormalities in the RAO projection. 3. Right coronary artery:  The right coronary artery was a small     nondominant vessel.  No angiographic coronary artery disease. 4. Left main: The left main had no disease. 5. Left circumflex system:  The left circumflex system was large and     dominant with a left-sided PDA; there was no significant     coronary artery disease. 6. LAD system:  There is no significant coronary artery disease.  IMPRESSION:  The patient has no significant coronary artery disease.  He has normal left ventricular systolic function, left ventricular end- diastolic pressure is only minimally elevated.  I  suspect that he had a false positive Myoview and his chest pain is noncardiac.     Marca Ancona, MD     DM/MEDQ  D:  05/11/2011  T:  05/11/2011  Job:  [id number]  cc:   Luis Abed, MD, [facility]  Electronically Signed by Marca Ancona MD on 06/05/2011 10:32:10 PM

## 2012-12-07 ENCOUNTER — Encounter: Payer: Self-pay | Admitting: Cardiology

## 2013-01-09 ENCOUNTER — Ambulatory Visit
Admission: RE | Admit: 2013-01-09 | Discharge: 2013-01-09 | Disposition: A | Discharge status: Home / Self Care | Payer: 59 | Source: Ambulatory Visit | Attending: Physician Assistant | Admitting: Physician Assistant

## 2013-01-09 ENCOUNTER — Other Ambulatory Visit: Payer: Self-pay | Admitting: Physician Assistant

## 2013-01-09 DIAGNOSIS — R1032 Left lower quadrant pain: Secondary | ICD-10-CM

## 2013-01-09 MED ORDER — IOHEXOL 300 MG/ML  SOLN
125.0000 mL | Freq: Once | INTRAMUSCULAR | Status: AC | PRN
  Administered 2013-01-09: 125 mL via INTRAVENOUS

## 2013-01-09 MED ORDER — IOHEXOL 300 MG/ML  SOLN
30.0000 mL | Freq: Once | INTRAMUSCULAR | Status: AC | PRN
  Administered 2013-01-09: 30 mL via ORAL

## 2013-05-28 IMAGING — CT CT ABD-PELV W/ CM
2 of 5 series · 16 of 46 positions shown, 18 images · IV contrast (30CC OMNI 300 & [ID] OMNI 300)
Comparison: Acute abdominal radiographic series 01/07/2013

CLINICAL DATA: Left lower quadrant pain, possible diverticulitis

CT ABDOMEN AND PELVIS WITH CONTRAST
TECHNIQUE: Multidetector CT imaging of the abdomen and pelvis was
performed following the standard protocol during bolus
administration of intravenous contrast.
Contrast: 30mL OMNIPAQUE IOHEXOL 300 MG/ML  SOLN, 125mL OMNIPAQUE
IOHEXOL 300 MG/ML  SOLN

[Series 2: abd/pelvis with · axial · 0.85mm/px · z∈[-390,+70]mm · 13 of 104 slices shown, 15 images]
[im 6/104  soft-tissue]
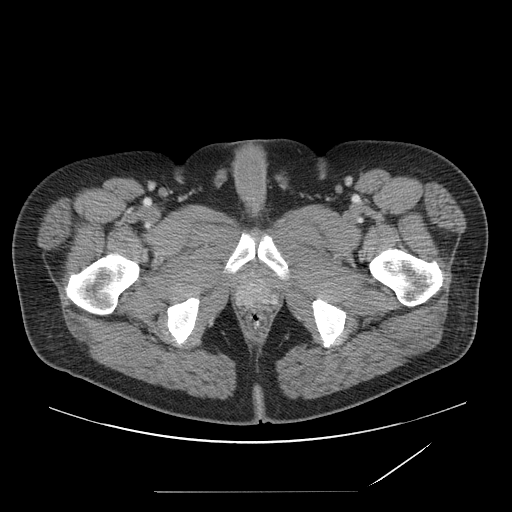
[im 6/104  bone]
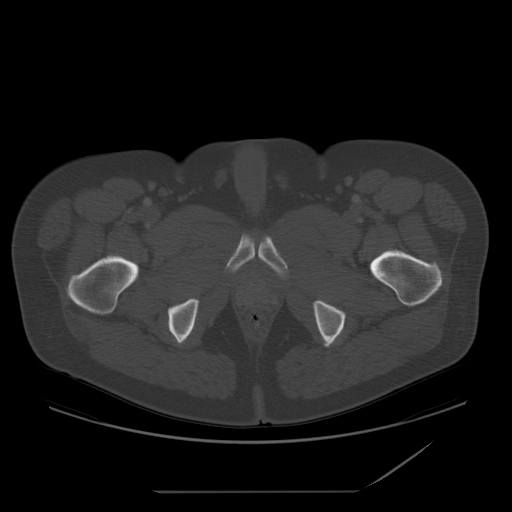
[im 16/104  soft-tissue]
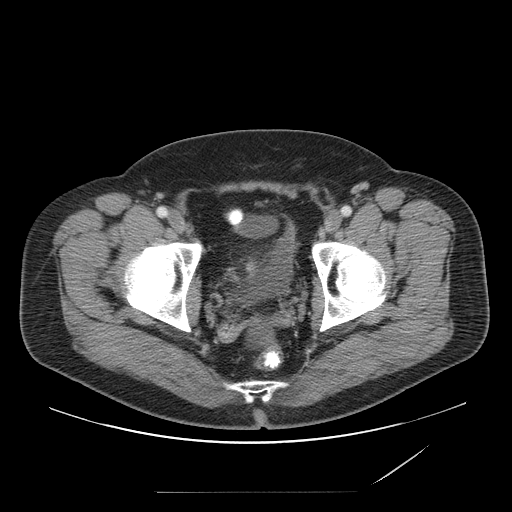
[im 21/104  soft-tissue]
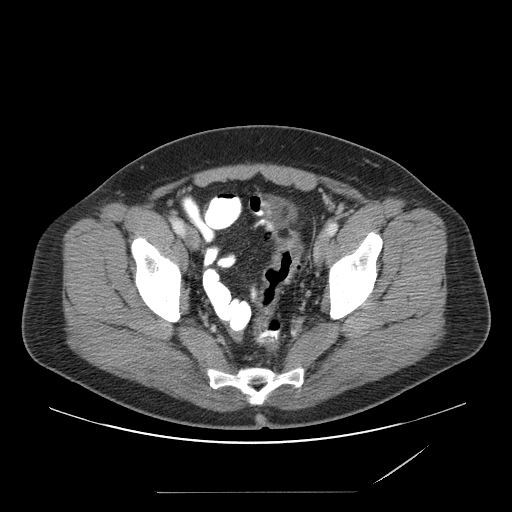
[im 31/104  soft-tissue]
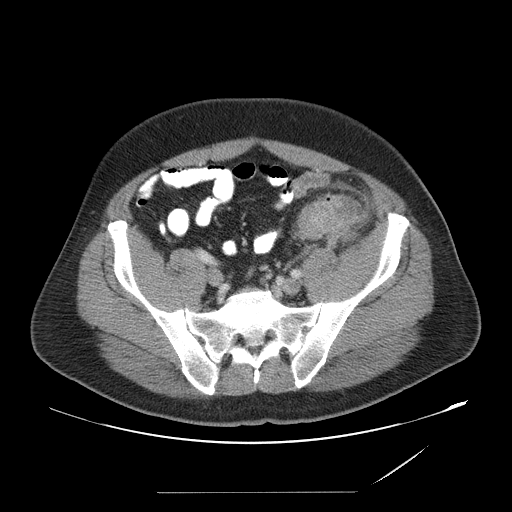
[im 37/104  soft-tissue]
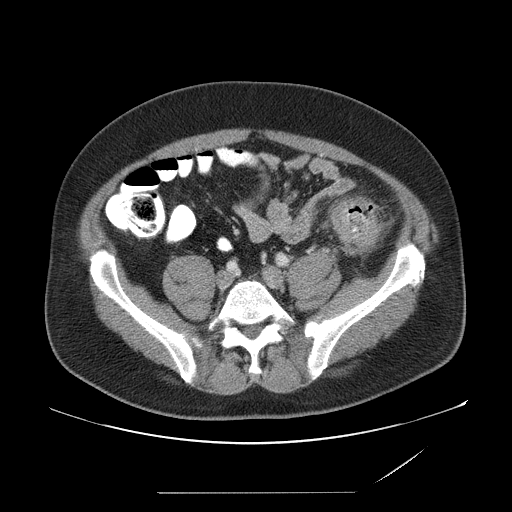
[im 47/104  soft-tissue]
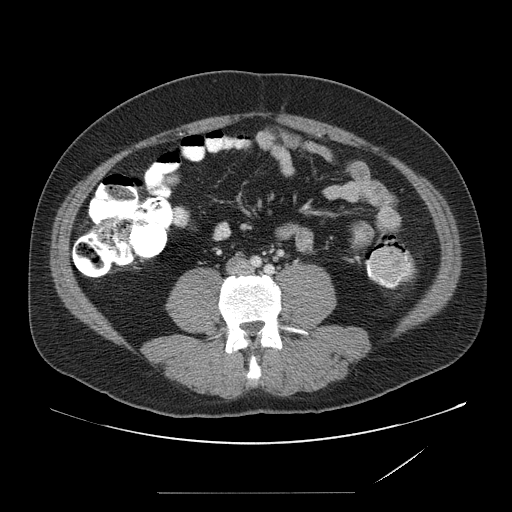
[im 52/104  soft-tissue]
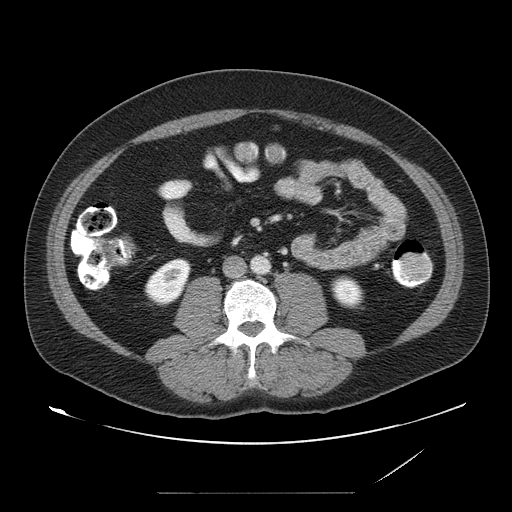
[im 57/104  soft-tissue]
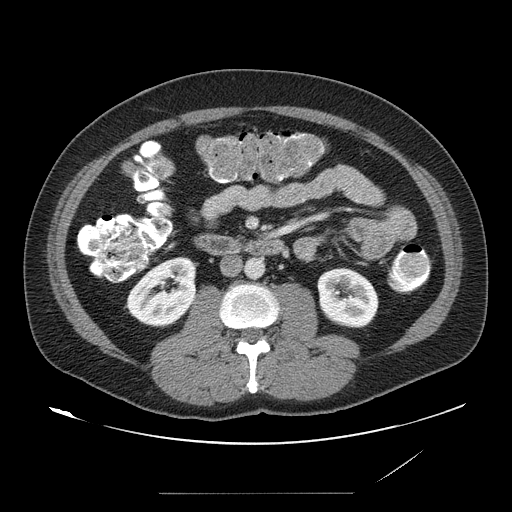
[im 67/104  soft-tissue]
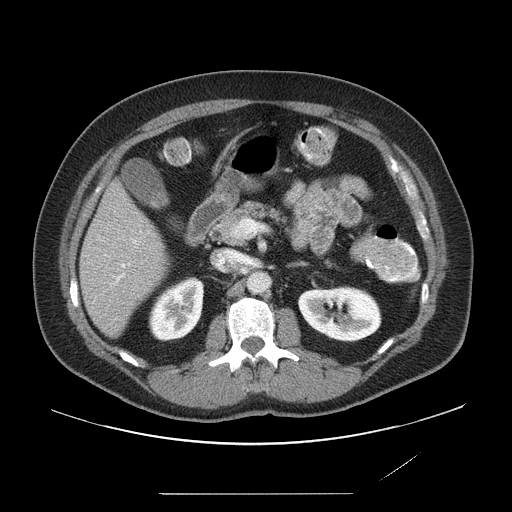
[im 67/104  bone]
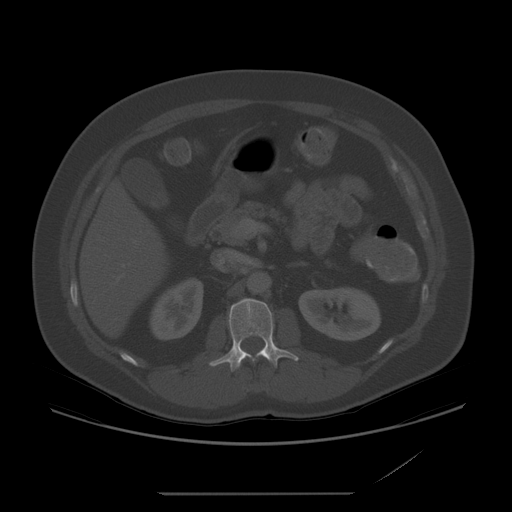
[im 73/104  soft-tissue]
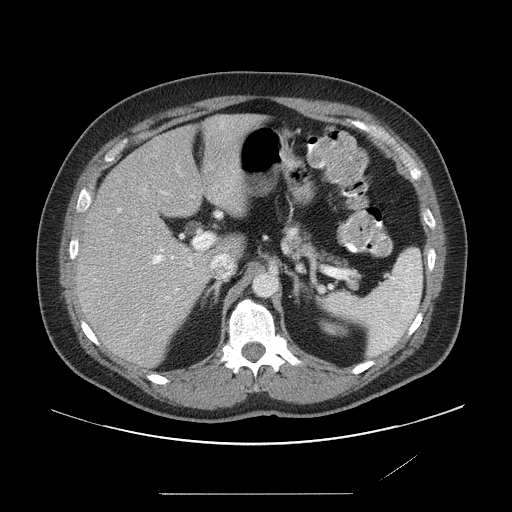
[im 83/104  soft-tissue]
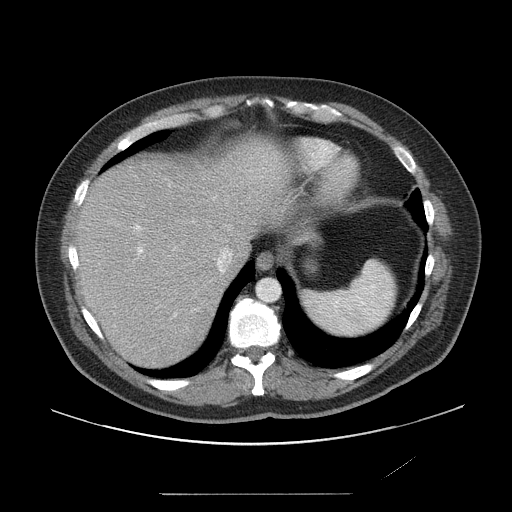
[im 88/104  soft-tissue]
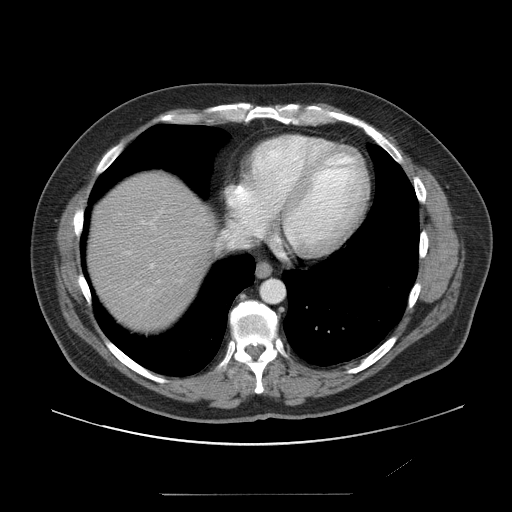
[im 98/104  soft-tissue]
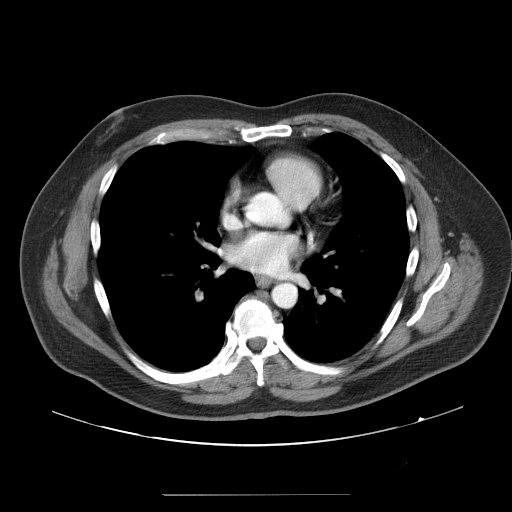

[Series 400: coronal · coronal · 1.09mm/px · 3 of 133 slices shown]
[im 45/133  soft-tissue]
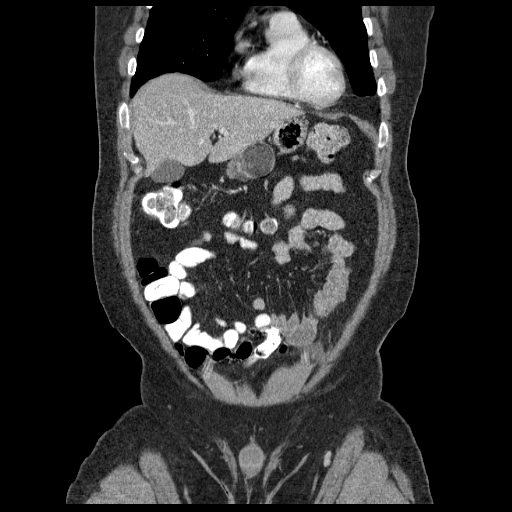
[im 59/133  soft-tissue]
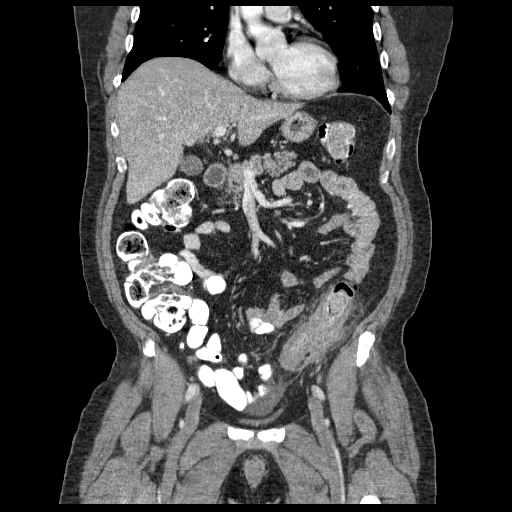
[im 74/133  soft-tissue]
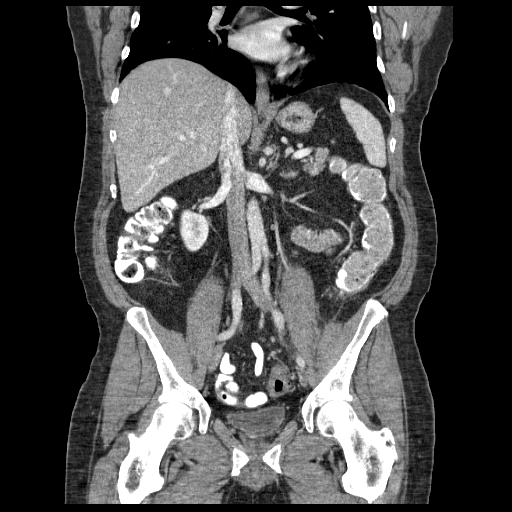

[16 of 46 positions shown; findings below may reference images not displayed]

FINDINGS: Lower Chest:  The lung bases are clear.  The visualized cardiac
structures within normal limits for size.  No pericardial effusion.
There is a small hiatal hernia.

Abdomen: Unremarkable CT appearance of the stomach, duodenum,
spleen, adrenal glands and pancreas.  Normal hepatic contour and
parenchymal density.  No focal hepatic lesion identified.
Gallbladder is unremarkable. No intra or extrahepatic biliary
ductal dilatation.

Unremarkable CT appearance of the kidneys.  No focal enhancing
lesion, hydronephrosis or nephrolithiasis.

Normal-caliber bowel throughout the abdomen.  No evidence of
obstruction.  Focal segment of circumferential submucosal
thickening of the sigmoid colonic wall extending into the proximal
descending colon.  There is surrounding inflammation in the sigmoid
mesial colon and pericolonic fat. A small 2.8 x 2.8 cm focal
phlegmon is noted adjacent to the affected colon.  Fluid is not
loculated or ring enhancing.  Additional colonic diverticular noted
and descending colon.  The appendix is identified in the right
lower quadrant and is unremarkable.  This

Pelvis: Trace free fluid in the pelvis.  The bladder prostate gland
and seminal vesicles are unremarkable.

Bones: No acute fracture or aggressive appearing lytic or blastic
osseous lesion.

Vascular: No significant atherosclerotic vascular disease or
vascular abnormality.
IMPRESSION: Focal thickening of the distal descending and sigmoid colonic wall
with associated surrounding inflammatory change and adjacent small
focal phlegmon consistent with acute diverticulitis.

Currently, there is no defined abscess to approach for CT-guided
drainage.  However, if the patient does not respond to traditional
antibiotic therapy, then repeat CT scan may be warranted to
evaluate for development of a discrete abscess in the region of
phlegmonous change.

## 2014-07-26 ENCOUNTER — Encounter: Payer: Self-pay | Admitting: Physician Assistant

## 2019-02-13 ENCOUNTER — Telehealth: Payer: Self-pay | Admitting: Neurology

## 2019-02-13 NOTE — Telephone Encounter (Signed)
error 

## 2019-02-19 ENCOUNTER — Ambulatory Visit (INDEPENDENT_AMBULATORY_CARE_PROVIDER_SITE_OTHER): Payer: Managed Care, Other (non HMO) | Admitting: Neurology

## 2019-02-19 ENCOUNTER — Encounter: Payer: Self-pay | Admitting: Neurology

## 2019-02-19 ENCOUNTER — Other Ambulatory Visit: Payer: Self-pay

## 2019-02-19 VITALS — BP 143/89 | HR 90 | Temp 98.1°F | Ht 70.0 in | Wt 251.0 lb

## 2019-02-19 DIAGNOSIS — G4719 Other hypersomnia: Secondary | ICD-10-CM | POA: Diagnosis not present

## 2019-02-19 DIAGNOSIS — G4733 Obstructive sleep apnea (adult) (pediatric): Secondary | ICD-10-CM

## 2019-02-19 DIAGNOSIS — Z789 Other specified health status: Secondary | ICD-10-CM | POA: Diagnosis not present

## 2019-02-19 DIAGNOSIS — Z9989 Dependence on other enabling machines and devices: Secondary | ICD-10-CM

## 2019-02-19 NOTE — Patient Instructions (Signed)
   Rv in 3 month after your HST will tell us if you have apnea, and to what degree.  Screening for Sleep Apnea  Sleep apnea is a condition in which breathing pauses or becomes shallow during sleep. Sleep apnea screening is a test to determine if you are at risk for sleep apnea. The test is easy and only takes a few minutes. Your health care provider may ask you to have this test in preparation for surgery or as part of a physical exam. What are the symptoms of sleep apnea? Common symptoms of sleep apnea include:  Snoring.  Restless sleep.  Daytime sleepiness.  Pauses in breathing.  Choking during sleep.  Irritability.  Forgetfulness.  Trouble thinking clearly.  Depression.  Personality changes. Most people with sleep apnea are not aware that they have it. Why should I get screened? Getting screened for sleep apnea can help:  Ensure your safety. It is important for your health care providers to know whether or not you have sleep apnea, especially if you are having surgery or have other long-term (chronic) health conditions.  Improve your health and allow you to get a better night's rest. Restful sleep can help you: ? Have more energy. ? Lose weight. ? Improve high blood pressure. ? Improve diabetes management. ? Prevent stroke. ? Prevent car accidents. How is screening done? Screening usually includes being asked a list of questions about your sleep quality. Some questions you may be asked include:  Do you snore?  Is your sleep restless?  Do you have daytime sleepiness?  Has a partner or spouse told you that you stop breathing during sleep?  Have you had trouble concentrating or memory loss? If your screening test is positive, you are at risk for the condition. Further testing may be needed to confirm a diagnosis of sleep apnea. Where to find more information You can find screening tools online or at your health care clinic. For more information about sleep apnea  screening and healthy sleep, visit these websites:  Centers for Disease Control and Prevention: [employer]  American Sleep Apnea Association: www.sleepapnea.org Contact a health care provider if:  You think that you may have sleep apnea. Summary  Sleep apnea screening can help determine if you are at risk for sleep apnea.  It is important for your health care providers to know whether or not you have sleep apnea, especially if you are having surgery or have other chronic health conditions.  You may be asked to take a screening test for sleep apnea in preparation for surgery or as part of a physical exam. This information is not intended to replace advice given to you by your health care provider. Make sure you discuss any questions you have with your health care provider. Document Released: 12/17/2016 Document Revised: 12/17/2016 Document Reviewed: 12/17/2016 Elsevier Interactive Patient Education  2019 [employer].

## 2019-02-19 NOTE — Progress Notes (Signed)
SLEEP MEDICINE CLINIC    Provider:  Melvyn Novas, MD  Referring Provider:   Primary Care Physician:  Ailene Ravel, MD  Chief Complaint  Patient presents with   New Patient (Initial Visit)    pt alone, rm 10. pt states that he used a machine for a while and had difficulty with mask coming off. the machine he has he has not used in a yr or more. last     HPI:  Jonathan Mcfarland is a 48 y.o. male pateient is seen here as a referral  from [facility].   Medical history: diabetes, obesity, HTN,    Family history : nobody else sing CPAP, patient was adopted.   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  Adopted, Married, with step children ( daughter 47, son is 88). Ex smoker, quit in 2001.   ETOH - social occasion.  Caffeine : uses soda - interferes with DM, HbA1c. Diet sodas. Non caffeineated.  Sweet tea 2 weekly.  Coffee - only in winter.   Sleep habits-  Dinner time is 6-8 PM and he goes to bed by 9 Pm, at home watching TV I the den, not in bedroom  by 10- 11 PM.  Bedroom is cool, quiet and dark.  No trouble to sleep except when mask leaks air.  Sleeps for 2 hours at a time, frequent nocturia -( fasziglia), he accumulates usually a total sleep time of 4-5 hours nightly only.  Rises at 5.30 AM-  Wakes up non refreshed, not restored.   Review of Systems: Out of a complete 14 system review, the patient complains of only the following symptoms, and all other reviewed systems are negative.  How likely are you to doze in the following situations: 0 = not likely, 1 = slight chance, 2 = moderate chance, 3 = high chance  Sitting and Reading?3  Watching Television? 2 Sitting inactive in a public place (theater or meeting)? 2 Lying down in the afternoon when circumstances permit? 3 Sitting and  talking to someone?1 Sitting quietly after lunch without alcohol? 3 In a car, while stopped for a few minutes in traffic?1  As a passenger in a car for an hour without a break? 3  Total = 18   / 24 points. FSS at 22/ 63 points.     Social History   Socioeconomic History   Marital status: Married    Spouse name: Not on file   Number of children: 2   Years of education: Not on file   Highest education level: Not on file  Occupational History    Employer: [employer]  Social Needs   Financial resource strain: Not on file   Food insecurity:    Worry: Not on file    Inability: Not on file   Transportation needs:    Medical: Not on file    Non-medical: Not on file  Tobacco Use   Smoking status: Former Smoker   Smokeless tobacco: Current User    Types: Chew  Substance and Sexual Activity   Alcohol use: Yes    Comment: occasionally   Drug use: No   Sexual activity: Not on file  Lifestyle   Physical activity:    Days per week: Not on file    Minutes per session:  Not on file   Stress: Not on file  Relationships   Social connections:    Talks on phone: Not on file    Gets together: Not on file    Attends religious service: Not on file    Active member of club or organization: Not on file    Attends meetings of clubs or organizations: Not on file    Relationship status: Not on file   Intimate partner violence:    Fear of current or ex partner: Not on file    Emotionally abused: Not on file    Physically abused: Not on file    Forced sexual activity: Not on file  Other Topics Concern   Not on file  Social History Narrative   Not on file    Family History  Adopted: Yes  Problem Relation Age of Onset   Heart attack Maternal Uncle 6   Cancer Mother    Heart disease Mother     Past Medical History:  Diagnosis Date   Asthma    Chest pain    Abnormal treadmill, 2012  /  nuclear April 22, 2011 mild anterior ischemia   Diabetes mellitus  without complication (HCC)    Diverticulosis    Ejection fraction    EF 55-60%, echo, July, 2012   GERD (gastroesophageal reflux disease)    Hyperlipidemia    Hypertension    Reflux     Past Surgical History:  Procedure Laterality Date   INGUINAL HERNIA REPAIR  11/05/05    Current Outpatient Medications  Medication Sig Dispense Refill   albuterol (VENTOLIN HFA) 108 (90 Base) MCG/ACT inhaler Inhale 1-2 puffs into the lungs every 6 (six) hours as needed for wheezing or shortness of breath.     FARXIGA 10 MG TABS tablet TAKE 1 TABLET BY MOUTH IN THE MORNING FOR DIABETES     glipiZIDE (GLUCOTROL XL) 10 MG 24 hr tablet TAKE 1 TABLET BY MOUTH TWICE DAILY FOR DIABETES     HUMIRA PEN 40 MG/0.4ML PNKT      JANUVIA 100 MG tablet      modafinil (PROVIGIL) 200 MG tablet TAKE 1 TABLET BY MOUTH ONCE DAILY FOR ALERTNESS     testosterone cypionate (DEPOTESTOSTERONE CYPIONATE) 200 MG/ML injection INJECT 2 ML INTRAMUSCULARLY EVERY TWO WEEKS     No current facility-administered medications for this visit.     Allergies as of 02/19/2019 - Review Complete 02/19/2019  Allergen Reaction Noted   Ciprofloxacin     Percocet [oxycodone-acetaminophen]      Vitals: BP (!) 143/89    Pulse 90    Temp 98.1 F (36.7 C)    Ht  (1.778 m)    Wt 251 lb (113.9 kg)    BMI 36.01 kg/m  Last Weight:  Wt Readings from Last 1 Encounters:  02/19/19 251 lb (113.9 kg)   Last Height:   Ht Readings from Last 1 Encounters:  02/19/19  (1.778 m)    Physical exam:  General: The patient is awake, alert and appears not in acute distress. The patient is well groomed. Head: Normocephalic, atraumatic. Neck is supple.  Mallampati: 4, neck circumference:18"  Cardiovascular:  Regular rate and rhythm, without  murmurs or carotid bruit, and without distended neck veins. Respiratory: Lungs are clear to auscultation. Skin:  Without evidence of edema, or rash Trunk: BMI is 36= elevated and patient   has normal posture.  Neurologic exam : The patient is awake and alert, oriented to place and time.  Memory subjective  described as intact. There is a normal attention span & concentration ability.  Speech is fluent without  dysarthria, dysphonia or aphasia. Mood and affect are appropriate.  Cranial nerves: Pupils are equal and briskly reactive to light. Funduscopic exam without evidence of pallor or edema. Extraocular movements  in vertical and horizontal planes intact and without nystagmus.  Visual fields by finger perimetry are intact. Hearing to finger rub intact.  Facial sensation intact to fine touch. Facial motor strength is symmetric and tongue and uvula move midline. Tongue protrusion into either cheek is normal. Shoulder shrug is normal.   Motor exam:  Normal tone, muscle bulk and symmetric strength in all extremities. Sensory:  Fine touch, pinprick and vibration were tested in all extremities. Proprioception was normal.  Coordination: Rapid alternating movements in the fingers/hands were normal.  Finger-to-nose maneuver  normal without evidence of ataxia, dysmetria or tremor.  Gait and station: Patient walks without assistive device a Deep tendon reflexes: in the  upper and lower extremities are symmetric and intact.   Assessment:  After physical and neurologic examination, review of laboratory studies, imaging, neurophysiology testing and pre-existing records, assessment is that of :   I have the pleasure of meeting Clydene Fakeaniel J. Gilliland today a 48 year old right-handed Caucasian gentleman with a more than 10-year history of known obstructive sleep apnea and CPAP treatment.  The patient is originally diagnosed in [place] [facility] but Not Followed by a Designated Medical Professional This Was in 2010 at the Time of His Last to Sleep Study.  Initially He Was Worked up with [facility] Then Became [facility].  He Received a New CPAP Machine Which She Likes Much  Better but Has Still Trouble Tolerating the Interface and the [employer] Air Leakage.  In Addition He Endorsed a Very High Daytime Sleepiness Degree with 18 Points on the Epworth Sleepiness Scale.  He Also Has Grown a Beard Which May Interfere with Getting a Good Seal by Itself.    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  He Uses Albuterol As Needed for That Part.  He Has Been on Humira Temporarily Due To Insurance Coverage Issues.    I Would like for the Patient to Undergo a Regular Home Sleep Test and a Attended 15 for an Interface.  The Goal Has To Be to Allow Him the Most Comfortable Interface and Therefore the Best Compliance.  I Looked at Recent Metabolic Panel Labs and I also Found the Original Sleep Report Which Was under the Guidance of [facility].  Date of the Study Was 13 July 2009.  He Had an AHI of What Appears to Be 18/H Which Is Mild to Moderate Apnea.  The Rest Is Very Difficult to Decipher.  Plan:  Treatment plan and additional workup :  His degree and type of apnea has not been re evaluated since 2010, I offered a HST to do that.  following will not just need new mask, but also may need to change setting the autotitration range.  No download available.     Patient would like to try a nasal cradle or nasal pillow, and he is still a [facility] patient.  He likes to try a bell swift , with ear loops.  He w owns his machine outright, and would like to change DME. He lives in [place], [place]  .  Can we start monitoring the compliance through this office or through DME ?  Next year his machine is due to replaced.     Jonathan Mylar Joseandres Mazer MD 02/19/2019

## 2019-05-07 ENCOUNTER — Ambulatory Visit (INDEPENDENT_AMBULATORY_CARE_PROVIDER_SITE_OTHER): Payer: Managed Care, Other (non HMO) | Admitting: Neurology

## 2019-05-07 DIAGNOSIS — G4733 Obstructive sleep apnea (adult) (pediatric): Secondary | ICD-10-CM

## 2019-05-07 DIAGNOSIS — Z789 Other specified health status: Secondary | ICD-10-CM

## 2019-05-07 DIAGNOSIS — G4719 Other hypersomnia: Secondary | ICD-10-CM

## 2019-05-23 ENCOUNTER — Telehealth: Payer: Self-pay | Admitting: *Deleted

## 2019-05-23 NOTE — Telephone Encounter (Signed)
-----   Message from Gildardo Griffes, RN sent at 05/23/2019  5:15 PM EDT -----  ----- Message ----- From: Larey Seat, MD Sent: 05/23/2019   5:09 PM EDT To: Darleen Crocker, RN  Summary & Diagnosis:    The patient has mild to moderate OSA at AHI 16.8/h but strongly  exacerbated during REM sleep to AHI of 39.5/h and loud snoring is  present. More apnea in right side sleep position than in supine.  No prolonged desaturation.   Recommendations:     This REM sleep accentuated type of OSA needs to be treated with  either CPAP or Inspire procedure, a dental device will not be  successful.  If the patient doesn't object, I will order heated humidified  auto CPAP with pressure setting from 5-15 cm H2O, 2 cm EPR and  mask of his choosing.   Physician Name: Larey Seat, MD

## 2019-05-23 NOTE — Telephone Encounter (Signed)
Called pt and LVM asking for call back to discuss his sleep study results. Left office number in message.  

## 2019-05-23 NOTE — Addendum Note (Signed)
Addended by: Larey Seat on: 05/23/2019 05:09 PM   Modules accepted: Orders

## 2019-05-23 NOTE — Procedures (Signed)
  Patient Information     First Name: Jonathan Last Name: Mcfarland ID: [id number]  Birth Date: 1971-07-27 Age: 48 Gender: Male  Referring Provider: Deforest Hoyles BMI: 36.0 (W=251 lb, H=5' 10'')  Neck Circ 18 '' Epworth:  18   Sleep Study Information    Study Date: May 07, 2019 S/H/A Version: 001.001.001.001 / 4.1.1528 / 60  History:     HPI:  Jonathan Mcfarland is a 48 y.o. male patient is seen upon a referral from [place] Cyndi Bender on 02-19-2019.  The patient had carried a diagnosis of PSA but hasn't used CPAP in over a year.  Dx: untreated OSA, DM 2, Morbid obesity, HTN, GERD and EDS with Epworth score of 18/ 24 points.       Summary & Diagnosis:     The patient has mild to moderate OSA at AHI 16.8/h but strongly exacerbated during REM sleep to AHI of 39.5/h and loud snoring is present. More apnea in right side sleep position than in supine. No prolonged desaturation.  Recommendations:      This REM sleep accentuated type of OSA needs to be treated with either CPAP or Inspire procedure, a dental device will not be successful.  If the patient doesn't object, I will order heated humidified auto CPAP with pressure setting from 5-15 cm H2O, 2 cm EPR and mask of his choosing.   Physician Name:  Larey Seat, MD                         Sleep Summary  Oxygen Saturation Statistics   Start Study Time: End Study Time: Total Recording Time:  9:57:25 PM 5:51:32 AM 7 h, 28min  Total Sleep Time % REM of Sleep Time:  6 h, 30 min 33.2    Mean: 94 Minimum: 74 Maximum: 99  Mean of Desaturations Nadirs (%):   89  Oxygen Desat. %:  4-9 10-20 >20 Total  Events Number Total   55 82.1   12  0 17.9 0.0  67 100.0  Oxygen Saturation: <90 <=88  <85 <80 <70  Duration (minutes): Sleep % 8.9 2.3 6.8 1.7  2.1 0.4  0.5 0.1 0.0 0.0     Respiratory Indices      Total Events REM NREM All Night  pRDI:  143  pAHI:  109 ODI:  67  pAHIc:  0  % CSR: 0.0 43.7 39.5 26.5 0.0  11.3 5.5 2.3 0.0 22.0 16.8 10.3 0.0       Pulse Rate Statistics during Sleep (BPM)      Mean:  66 Minimum: 49 Maximum: 91    Indices are calculated using technically valid sleep time of  6 hrs, 29 min. pRDI/pAHI are calculated using oxi desaturations ? 3% Si Body Position Statistics  Position Supine Prone Right Left Non-Supine  Sleep (min) 197.1 0.0 80.5 112.5 193.0  Sleep % 50.5 0.0 20.6 28.8 49.5  pRDI 24.1 N/A 30.6 12.3 19.9  pAHI 18.6 N/A 23.9 8.5 14.9  ODI 13.1 N/A 13.4 3.2 7.5     Snoring Statistics Snoring Level (dB) >40 >50 >60 >70 >80 >Threshold (45)  Sleep (min) 323.4 76.4 5.2 0.1 0.0 149.2  Sleep % 82.9 19.6 1.3 0.0 0.0 38.2    Mean: 45 dB Sleep Stages Chart

## 2019-05-30 NOTE — Telephone Encounter (Signed)
Pt returned call and I was able to review sleep study. I advised pt that Dr. Brett Fairy reviewed their sleep study results and found that pt mild to moderate sleep apnea. Dr. Brett Fairy recommends that pt start auto CPAP 5-15. I reviewed PAP compliance expectations with the pt. Pt is agreeable to starting a CPAP. I advised pt that an order will be sent to a DME, [facility], and [facility] will call the pt within about one week after they file with the pt's insurance. [facility] will show the pt how to use the machine, fit for masks, and troubleshoot the CPAP if needed. A follow up appt was made for insurance purposes with Debbora Presto on 7:30 am on Nov 16,2020. Pt verbalized understanding to arrive 15 minutes early and bring their CPAP. A letter with all of this information in it will be mailed to the pt as a reminder. I verified with the pt that the address we have on file is correct. Pt verbalized understanding of results. Pt had no questions at this time but was encouraged to call back if questions arise. I have sent the order to [facility] and have received confirmation that they have received the order.

## 2019-05-30 NOTE — Telephone Encounter (Signed)
Called patient, 2nd attempt to discuss sleep study results. No answer at this time. LVM for the patient to call back.

## 2019-07-05 NOTE — Telephone Encounter (Signed)
Received an email from [facility] stating,  "Just wanted to let you know that I have tried contacting this patient on 9/30, 10/5, and 10/7. No answer I have left voicemails. There is not an email on file. I am going to void this order. "  Pt will have to call [facility] when he is ready to start.

## 2019-08-06 ENCOUNTER — Ambulatory Visit: Payer: Self-pay | Admitting: Family Medicine

## 2019-09-12 ENCOUNTER — Ambulatory Visit: Payer: Self-pay | Admitting: Family Medicine

## 2019-11-19 ENCOUNTER — Ambulatory Visit: Payer: Self-pay | Admitting: Family Medicine

## 2020-01-03 ENCOUNTER — Other Ambulatory Visit: Payer: Self-pay

## 2020-01-03 ENCOUNTER — Encounter: Payer: Self-pay | Admitting: Family Medicine

## 2020-01-03 ENCOUNTER — Ambulatory Visit: Payer: Managed Care, Other (non HMO) | Admitting: Family Medicine

## 2020-01-03 VITALS — BP 154/96 | HR 77 | Temp 97.4°F | Ht 70.0 in | Wt 251.4 lb

## 2020-01-03 DIAGNOSIS — Z9989 Dependence on other enabling machines and devices: Secondary | ICD-10-CM

## 2020-01-03 DIAGNOSIS — G4733 Obstructive sleep apnea (adult) (pediatric): Secondary | ICD-10-CM

## 2020-01-03 NOTE — Progress Notes (Signed)
Message to [facility] for new cpap supplies. Received. sy

## 2020-01-03 NOTE — Patient Instructions (Signed)
Please continue using your CPAP regularly. While your insurance requires that you use CPAP at least 4 hours each night on 70% of the nights, I recommend, that you not skip any nights and use it throughout the night if you can. Getting used to CPAP and staying with the treatment long term does take time and patience and discipline. Untreated obstructive sleep apnea when it is moderate to severe can have an adverse impact on cardiovascular health and raise her risk for heart disease, arrhythmias, hypertension, congestive heart failure, stroke and diabetes. Untreated obstructive sleep apnea causes sleep disruption, nonrestorative sleep, and sleep deprivation. This can have an impact on your day to day functioning and cause daytime sleepiness and impairment of cognitive function, memory loss, mood disturbance, and problems focussing. Using CPAP regularly can improve these symptoms.   Follow up in 1 year   Sleep Apnea Sleep apnea affects breathing during sleep. It causes breathing to stop for a short time or to become shallow. It can also increase the risk of:  Heart attack.  Stroke.  Being very overweight (obese).  Diabetes.  Heart failure.  Irregular heartbeat. The goal of treatment is to help you breathe normally again. What are the causes? There are three kinds of sleep apnea:  Obstructive sleep apnea. This is caused by a blocked or collapsed airway.  Central sleep apnea. This happens when the brain does not send the right signals to the muscles that control breathing.  Mixed sleep apnea. This is a combination of obstructive and central sleep apnea. The most common cause of this condition is a collapsed or blocked airway. This can happen if:  Your throat muscles are too relaxed.  Your tongue and tonsils are too large.  You are overweight.  Your airway is too small. What increases the risk?  Being overweight.  Smoking.  Having a small airway.  Being older.  Being  male.  Drinking alcohol.  Taking medicines to calm yourself (sedatives or tranquilizers).  Having family members with the condition. What are the signs or symptoms?  Trouble staying asleep.  Being sleepy or tired during the day.  Getting angry a lot.  Loud snoring.  Headaches in the morning.  Not being able to focus your mind (concentrate).  Forgetting things.  Less interest in sex.  Mood swings.  Personality changes.  Feelings of sadness (depression).  Waking up a lot during the night to pee (urinate).  Dry mouth.  Sore throat. How is this diagnosed?  Your medical history.  A physical exam.  A test that is done when you are sleeping (sleep study). The test is most often done in a sleep lab but may also be done at home. How is this treated?   Sleeping on your side.  Using a medicine to get rid of mucus in your nose (decongestant).  Avoiding the use of alcohol, medicines to help you relax, or certain pain medicines (narcotics).  Losing weight, if needed.  Changing your diet.  Not smoking.  Using a machine to open your airway while you sleep, such as: ? An oral appliance. This is a mouthpiece that shifts your lower jaw forward. ? A CPAP device. This device blows air through a mask when you breathe out (exhale). ? An EPAP device. This has valves that you put in each nostril. ? A BPAP device. This device blows air through a mask when you breathe in (inhale) and breathe out.  Having surgery if other treatments do not work. It is   important to get treatment for sleep apnea. Without treatment, it can lead to:  High blood pressure.  Coronary artery disease.  In men, not being able to have an erection (impotence).  Reduced thinking ability. Follow these instructions at home: Lifestyle  Make changes that your doctor recommends.  Eat a healthy diet.  Lose weight if needed.  Avoid alcohol, medicines to help you relax, and some pain  medicines.  Do not use any products that contain nicotine or tobacco, such as cigarettes, e-cigarettes, and chewing tobacco. If you need help quitting, ask your doctor. General instructions  Take over-the-counter and prescription medicines only as told by your doctor.  If you were given a machine to use while you sleep, use it only as told by your doctor.  If you are having surgery, make sure to tell your doctor you have sleep apnea. You may need to bring your device with you.  Keep all follow-up visits as told by your doctor. This is important. Contact a doctor if:  The machine that you were given to use during sleep bothers you or does not seem to be working.  You do not get better.  You get worse. Get help right away if:  Your chest hurts.  You have trouble breathing in enough air.  You have an uncomfortable feeling in your back, arms, or stomach.  You have trouble talking.  One side of your body feels weak.  A part of your face is hanging down. These symptoms may be an emergency. Do not wait to see if the symptoms will go away. Get medical help right away. Call your local emergency services (911 in the [place] Do not drive yourself to the hospital. Summary  This condition affects breathing during sleep.  The most common cause is a collapsed or blocked airway.  The goal of treatment is to help you breathe normally while you sleep. This information is not intended to replace advice given to you by your health care provider. Make sure you discuss any questions you have with your health care provider. Document Revised: 06/23/2018 Document Reviewed: 05/02/2018 Elsevier Patient Education  2020 [employer]  

## 2020-01-03 NOTE — Progress Notes (Signed)
PATIENT: Jonathan Mcfarland DOB: 1970/11/23  REASON FOR VISIT: follow up HISTORY FROM: patient  Chief Complaint  Patient presents with  . OSA on CPAP    rm 6 initial FU "no problems with CPAP, I like the mask"     HISTORY OF PRESENT ILLNESS: Today 01/03/20 Jonathan Mcfarland is a 49 y.o. male here today for follow up for OSA recently restarted on CPAP therapy.  Home sleep study revealed mild to moderate OSA with total AHI of 16.8/h exacerbated by REM sleep with AHI of 39.5/h.  No prolonged hypoxemia.  AutoPap therapy was ordered.  Patient reports that he is doing fair.  He is currently using a DreamWear full face mask and feels that it is much more comfortable than his previous mask.  He denies any difficulty with his CPAP machine.  Compliance report dated 12/03/2019 through 01/01/2020 reveals that he used CPAP 30 of the past 30 days for compliance of 100%.  He used CPAP greater than 4 hours all 30 days for compliance 100%.  Average usage was 8 hours and 6 minutes.  Residual AHI was 1.4 on 5 to 15 cm of water and an EPR of 2.  There was no significant leak noted.    HISTORY: (copied from Dr Dohmeier's note on 02/19/2019)  HPI:  [name] is a 49 y.o. male pateient is seen here as a referral  from [facility].   Medical history: diabetes, obesity, HTN,    Family history : nobody else sing CPAP, patient was adopted.   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  Adopted, Married, with step children ( daughter 2, son is 42). Ex smoker, quit in 2001.   ETOH - social occasion.  Caffeine : uses soda - interferes with DM, HbA1c. Diet sodas. Non caffeineated.  Sweet tea 2 weekly.  Coffee - only in winter.   Sleep habits-  Dinner time is 6-8 PM and he goes to bed by 9 Pm, at home watching TV I the den,  not in bedroom  by 10- 11 PM.  Bedroom is cool, quiet and dark.  No trouble to sleep except when mask leaks air.  Sleeps for 2 hours at a time, frequent nocturia -( fasziglia), he accumulates usually a total sleep time of 4-5 hours nightly only.  Rises at 5.30 AM-  Wakes up non refreshed, not restored.   Review of Systems: Out of a complete 14 system review, the patient complains of only the following symptoms, and all other reviewed systems are negative.  How likely are you to doze in the following situations: 0 = not likely, 1 = slight chance, 2 = moderate chance, 3 = high chance  Sitting and Reading?3  Watching Television? 2 Sitting inactive in a public place (theater or meeting)? 2 Lying down in the afternoon when circumstances permit? 3 Sitting and talking to someone?1 Sitting quietly after lunch without alcohol? 3 In a car, while stopped for a few minutes in traffic?1  As a passenger in a car for an hour without a break? 3  Total = 18   / 24 points. FSS at 22/ 63 points.    REVIEW OF SYSTEMS: Out of a complete 14 system review of symptoms, the patient complains only of the following symptoms, fatigue and  all other reviewed systems are negative.  ESS:13 FSS: 28   ALLERGIES: Allergies  Allergen Reactions  . Ciprofloxacin Itching  . Percocet [Oxycodone-Acetaminophen]     HOME MEDICATIONS: Outpatient Medications Prior to Visit  Medication Sig Dispense Refill  . albuterol (VENTOLIN HFA) 108 (90 Base) MCG/ACT inhaler Inhale 1-2 puffs into the lungs every 6 (six) hours as needed for wheezing or shortness of breath.    . cyclobenzaprine (FLEXERIL) 10 MG tablet Take 10 mg by mouth at bedtime.    Marland Kitchen FARXIGA 10 MG TABS tablet TAKE 1 TABLET BY MOUTH IN THE MORNING FOR DIABETES    . glipiZIDE (GLUCOTROL XL) 10 MG 24 hr tablet TAKE 1 TABLET BY MOUTH TWICE DAILY FOR DIABETES    . HUMIRA PEN 40 MG/0.4ML PNKT     . JANUVIA 100 MG tablet     . meloxicam (MOBIC) 15 MG tablet Take  15 mg by mouth daily.    . modafinil (PROVIGIL) 200 MG tablet TAKE 1 TABLET BY MOUTH ONCE DAILY FOR ALERTNESS    . rosuvastatin (CRESTOR) 5 MG tablet 5 mg daily.    Marland Kitchen testosterone cypionate (DEPOTESTOSTERONE CYPIONATE) 200 MG/ML injection INJECT 2 ML INTRAMUSCULARLY EVERY TWO WEEKS     No facility-administered medications prior to visit.    PAST MEDICAL HISTORY: Past Medical History:  Diagnosis Date  . Asthma   . Chest pain    Abnormal treadmill, 2012  /  nuclear April 22, 2011 mild anterior ischemia  . Diabetes mellitus without complication (HCC)   . Diverticulosis   . Ejection fraction    EF 55-60%, echo, July, 2012  . GERD (gastroesophageal reflux disease)   . Hyperlipidemia   . Hypertension   . OSA on CPAP   . Reflux     PAST SURGICAL HISTORY: Past Surgical History:  Procedure Laterality Date  . INGUINAL HERNIA REPAIR  11/05/05    FAMILY HISTORY: Family History  Adopted: Yes  Problem Relation Age of Onset  . Heart attack Maternal Uncle 70  . Cancer Mother   . Heart disease Mother     SOCIAL HISTORY: Social History   Socioeconomic History  . Marital status: Married    Spouse name: Not on file  . Number of children: 2  . Years of education: Not on file  . Highest education level: Not on file  Occupational History    Employer: [employer]  Tobacco Use  . Smoking status: Former Games developer  . Smokeless tobacco: Current User    Types: Chew  Substance and Sexual Activity  . Alcohol use: Yes    Comment: occasionally  . Drug use: No  . Sexual activity: Not on file  Other Topics Concern  . Not on file  Social History Narrative  . Not on file   Social Determinants of Health   Financial Resource Strain:   . Difficulty of Paying Living Expenses:   Food Insecurity:   . Worried About Programme researcher, broadcasting/film/video in the Last Year:   . Barista in the Last Year:   Transportation Needs:   . Freight forwarder (Medical):   Marland Kitchen Lack of Transportation  (Non-Medical):   Physical Activity:   . Days of Exercise per Week:   . Minutes of Exercise per Session:   Stress:   . Feeling of Stress :   Social Connections:   . Frequency of Communication with Friends and Family:   . Frequency of Social Gatherings with Friends and Family:   .  Attends Religious Services:   . Active Member of Clubs or Organizations:   . Attends Banker Meetings:   Marland Kitchen Marital Status:   Intimate Partner Violence:   . Fear of Current or Ex-Partner:   . Emotionally Abused:   Marland Kitchen Physically Abused:   . Sexually Abused:       PHYSICAL EXAM  Vitals:   01/03/20 0926  BP: (!) 154/96  Pulse: 77  Temp: (!) 97.4 F (36.3 C)  Weight: 251 lb 6.4 oz (114 kg)  Height: 5\' 10"  (1.778 m)   Body mass index is 36.07 kg/m.  Generalized: Well developed, in no acute distress  Cardiology: normal rate and rhythm, no murmur noted Respiratory: clear to auscultation bilaterally  Neurological examination  Mentation: Alert oriented to time, place, history taking. Follows all commands speech and language fluent Cranial nerve II-XII: Pupils were equal round reactive to light. Extraocular movements were full, visual field were full  Motor: The motor testing reveals 5 over 5 strength of all 4 extremities. Good symmetric motor tone is noted throughout.  Gait and station: Gait is normal.   DIAGNOSTIC DATA (LABS, IMAGING, TESTING) - I reviewed patient records, labs, notes, testing and imaging myself where available.  No flowsheet data found.   Lab Results  Component Value Date   WBC 10.8 (H) 05/10/2011   HGB 14.7 05/10/2011   HCT 44.1 05/10/2011   MCV 83.4 05/10/2011   PLT 228.0 05/10/2011      Component Value Date/Time   NA 138 05/10/2011 1223   K 3.7 05/10/2011 1223   CL 102 05/10/2011 1223   CO2 24 05/10/2011 1223   GLUCOSE 95 05/10/2011 1223   BUN 14 05/10/2011 1223   CREATININE 1.0 05/10/2011 1223   CALCIUM 8.9 05/10/2011 1223   No results found for:  CHOL, HDL, LDLCALC, LDLDIRECT, TRIG, CHOLHDL No results found for: 05/12/2011 No results found for: VITAMINB12 No results found for: TSH     ASSESSMENT AND PLAN 49 y.o. year old male  has a past medical history of Asthma, Chest pain, Diabetes mellitus without complication (HCC), Diverticulosis, Ejection fraction, GERD (gastroesophageal reflux disease), Hyperlipidemia, Hypertension, OSA on CPAP, and Reflux. here with     ICD-10-CM   1. OSA on CPAP  G47.33 For home use only DME continuous positive airway pressure (CPAP)   Z99.89     Daryus is doing very well with CPAP therapy.  Compliance report reveals excellent compliance.  He was encouraged to continue using CPAP nightly and for greater than 4 hours each night.  Healthy lifestyle habits encouraged with adequate hydration, well-balanced diet and regular exercise.  He will continue close follow-up with primary care for comorbidity management.  He will follow-up with me in 1 year, sooner if needed.  He verbalizes understanding and agreement with this plan.   Orders Placed This Encounter  Procedures  . For home use only DME continuous positive airway pressure (CPAP)    supplies    Order Specific Question:   Length of Need    Answer:   Lifetime    Order Specific Question:   Patient has OSA or probable OSA    Answer:   Yes    Order Specific Question:   Is the patient currently using CPAP in the home    Answer:   Yes    Order Specific Question:   Settings    Answer:   Other see comments    Order Specific Question:   CPAP supplies needed  Answer:   Mask, headgear, cushions, filters, heated tubing and water chamber     No orders of the defined types were placed in this encounter.     I spent 15 minutes with the patient. 50% of this time was spent counseling and educating patient on plan of care and medications.    Shawnie Dapper, FNP-C 01/03/2020, 10:21 AM [facility] [address], [address] [place], [place]  [address] [phone]

## 2020-03-26 ENCOUNTER — Telehealth: Payer: Self-pay | Admitting: *Deleted

## 2020-03-26 NOTE — Telephone Encounter (Signed)
We have never written this medication for him. I would have him reach out to the provider that started it.

## 2020-03-26 NOTE — Telephone Encounter (Signed)
Received fax from [facility] (have been trying to reach [place] regarding pts modafinil).  Last fill from drug registry was 03-03-20 #7 by Sharrie Rothman.  We see him for cpap,  Is this something that we will be providing for pt?

## 2020-03-26 NOTE — Telephone Encounter (Signed)
I called spoke to Napier Field, at [facility].  [phone], fax [phone].  They have been providing for pt re: to OSA diagnosis.  PA denied but would be approved if neurologist prescribes.  Pt will need appt, she will relay to pt.

## 2020-07-14 ENCOUNTER — Encounter: Payer: Self-pay | Admitting: Family Medicine

## 2021-01-05 ENCOUNTER — Ambulatory Visit: Payer: Managed Care, Other (non HMO) | Admitting: Family Medicine

## 2021-01-08 NOTE — Progress Notes (Signed)
PATIENT: Jonathan Mcfarland J Weygandt DOB: 04/09/1971  REASON FOR VISIT: follow up HISTORY FROM: patient  Chief Complaint  Patient presents with  . Obstructive Sleep Apnea    RM 2 alone Pt is well, has a crack tooth and psoriasis is causing irration with CPAP, does help when he is using      HISTORY OF PRESENT ILLNESS: 01/12/21 ALL: He returns for follow up for OSA on CPAP. He has had some difficulty with compliance due to a couple of medical conditions over the past few months. He had a cracked tooth a few months ago that was recently pulled. He has psoriasis and reports that the mask and headgear aggravates his skin. He has tried multiple masks and headgear in the past that he felt were worse. He is happy with his full face mask and headgear. He was on Humira and doing well. Recently, insurance denied. He was previously taking modafinil for EDS. He reports that he has not taken this medication in over a year. Insurance requires that rx come from sleep medicine. He reports that he nods off during the day. He can not sit still for any period of time. BP usually runs 120-130/70-80. Last saw PCP in January, 2022.      01/03/2020 ALL:  Jonathan SimpersDarrell J Klebba is a 50 y.o. male here today for follow up for OSA recently restarted on CPAP therapy.  Home sleep study revealed mild to moderate OSA with total AHI of 16.8/h exacerbated by REM sleep with AHI of 39.5/h.  No prolonged hypoxemia.  AutoPap therapy was ordered.  Patient reports that he is doing fair.  He is currently using a DreamWear full face mask and feels that it is much more comfortable than his previous mask.  He denies any difficulty with his CPAP machine.  Compliance report dated 12/03/2019 through 01/01/2020 reveals that he used CPAP 30 of the past 30 days for compliance of 100%.  He used CPAP greater than 4 hours all 30 days for compliance 100%.  Average usage was 8 hours and 6 minutes.  Residual AHI was 1.4 on 5 to 15 cm of water and an EPR of 2.   There was no significant leak noted.    HISTORY: (copied from Dr Dohmeier's note on 02/19/2019)  HPI:  Jonathan SimpersDarrell J Schlossberg is a 50 y.o. male pateient is seen here as a referral  [facility] [place] [facility].   Medical history: diabetes, obesity, HTN,    Family history : nobody else sing CPAP, patient was adopted.   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  Adopted, Married, with step children ( daughter [phone], son is 5926). Ex smoker, quit in 2001.   ETOH - social occasion.  Caffeine : uses soda - interferes with DM, HbA1c. Diet sodas. Non caffeineated.  Sweet tea 2 weekly.  Coffee - only in winter.   Sleep habits-  Dinner time is 6-8 PM and he goes to bed by 9 Pm, at home watching TV I the den, not in bedroom  by 10- 11 PM.  Bedroom is cool, quiet and dark.  No trouble to sleep except when mask leaks air.  Sleeps for 2 hours at a time, frequent nocturia -( fasziglia), he accumulates usually a total sleep time of 4-5 hours nightly only.  Rises at  5.30 AM-  Wakes up non refreshed, not restored.   Review of Systems: Out of a complete 14 system review, the patient complains of only the following symptoms, and all other reviewed systems are negative.  How likely are you to doze in the following situations: 0 = not likely, 1 = slight chance, 2 = moderate chance, 3 = high chance  Sitting and Reading?3  Watching Television? 2 Sitting inactive in a public place (theater or meeting)? 2 Lying down in the afternoon when circumstances permit? 3 Sitting and talking to someone?1 Sitting quietly after lunch without alcohol? 3 In a car, while stopped for a few minutes in traffic?1  As a passenger in a car for an hour without a break? 3  Total = 18   / 24 points. FSS at 22/ 63 points.    REVIEW OF SYSTEMS: Out of a  complete 14 system review of symptoms, the patient complains only of the following symptoms, fatigue and all other reviewed systems are negative.  ESS:17, previously 13 FSS: 32, previously 28   ALLERGIES: Allergies  Allergen Reactions  . Ciprofloxacin Itching  . Percocet [Oxycodone-Acetaminophen]     HOME MEDICATIONS: Outpatient Medications Prior to Visit  Medication Sig Dispense Refill  . FARXIGA 10 MG TABS tablet TAKE 1 TABLET BY MOUTH IN THE MORNING FOR DIABETES    . glipiZIDE (GLUCOTROL XL) 10 MG 24 hr tablet TAKE 1 TABLET BY MOUTH TWICE DAILY FOR DIABETES    . HUMIRA PEN 40 MG/0.4ML PNKT     . JANUVIA 100 MG tablet     . meloxicam (MOBIC) 15 MG tablet Take 15 mg by mouth daily.    . rosuvastatin (CRESTOR) 5 MG tablet 5 mg daily.    Marland Kitchen testosterone cypionate (DEPOTESTOSTERONE CYPIONATE) 200 MG/ML injection INJECT 2 ML INTRAMUSCULARLY EVERY TWO WEEKS    . cyclobenzaprine (FLEXERIL) 10 MG tablet Take 10 mg by mouth at bedtime.    Marland Kitchen albuterol (VENTOLIN HFA) 108 (90 Base) MCG/ACT inhaler Inhale 1-2 puffs into the lungs every 6 (six) hours as needed for wheezing or shortness of breath. (Patient not taking: Reported on 01/12/2021)    . modafinil (PROVIGIL) 200 MG tablet TAKE 1 TABLET BY MOUTH ONCE DAILY FOR ALERTNESS (Patient not taking: Reported on 01/12/2021)     No facility-administered medications prior to visit.    PAST MEDICAL HISTORY: Past Medical History:  Diagnosis Date  . Asthma   . Chest pain    Abnormal treadmill, 2012  /  nuclear April 22, 2011 mild anterior ischemia  . Diabetes mellitus without complication (HCC)   . Diverticulosis   . Ejection fraction    EF 55-60%, echo, July, 2012  . GERD (gastroesophageal reflux disease)   . Hyperlipidemia   . Hypertension   . OSA on CPAP   . Reflux     PAST SURGICAL HISTORY: Past Surgical History:  Procedure Laterality Date  . INGUINAL HERNIA REPAIR  11/05/05    FAMILY HISTORY: Family History  Adopted: Yes  Problem  Relation Age of Onset  . Heart attack Maternal Uncle 70  . Cancer Mother   . Heart disease Mother     SOCIAL HISTORY: Social History   Socioeconomic History  . Marital status: Married    Spouse name: Not on file  . Number of children: 2  . Years of education: Not on file  . Highest education level: Not on file  Occupational History    Employer: [employer]  Tobacco Use  .  Smoking status: Former Games developer  . Smokeless tobacco: Current User    Types: Chew  Substance and Sexual Activity  . Alcohol use: Yes    Comment: occasionally  . Drug use: No  . Sexual activity: Not on file  Other Topics Concern  . Not on file  Social History Narrative  . Not on file   Social Determinants of Health   Financial Resource Strain: Not on file  Food Insecurity: Not on file  Transportation Needs: Not on file  Physical Activity: Not on file  Stress: Not on file  Social Connections: Not on file  Intimate Partner Violence: Not on file      PHYSICAL EXAM  Vitals:   01/12/21 0748  BP: (!) 147/88  Pulse: 94  Weight: 247 lb (112 kg)  Height: 5\' 10"  (1.778 m)   Body mass index is 35.44 kg/m.  Generalized: Well developed, in no acute distress  Cardiology: normal rate and rhythm, no murmur noted Respiratory: clear to auscultation bilaterally  Neurological examination  Mentation: Alert oriented to time, place, history taking. Follows all commands speech and language fluent Cranial nerve II-XII: Pupils were equal round reactive to light. Extraocular movements were full, visual field were full  Motor: The motor testing reveals 5 over 5 strength of all 4 extremities. Good symmetric motor tone is noted throughout.  Gait and station: Gait is normal.   DIAGNOSTIC DATA (LABS, IMAGING, TESTING) - I reviewed patient records, labs, notes, testing and imaging myself where available.  No flowsheet data found.   Lab Results  Component Value Date   WBC 10.8 (H) 05/10/2011   HGB 14.7  05/10/2011   HCT 44.1 05/10/2011   MCV 83.4 05/10/2011   PLT 228.0 05/10/2011      Component Value Date/Time   NA 138 05/10/2011 1223   K 3.7 05/10/2011 1223   CL 102 05/10/2011 1223   CO2 24 05/10/2011 1223   GLUCOSE 95 05/10/2011 1223   BUN 14 05/10/2011 1223   CREATININE 1.0 05/10/2011 1223   CALCIUM 8.9 05/10/2011 1223   No results found for: CHOL, HDL, LDLCALC, LDLDIRECT, TRIG, CHOLHDL No results found for: 05/12/2011 No results found for: VITAMINB12 No results found for: TSH     ASSESSMENT AND PLAN 50 y.o. year old male  has a past medical history of Asthma, Chest pain, Diabetes mellitus without complication (HCC), Diverticulosis, Ejection fraction, GERD (gastroesophageal reflux disease), Hyperlipidemia, Hypertension, OSA on CPAP, and Reflux. here with     ICD-10-CM   1. OSA on CPAP  G47.33 For home use only DME continuous positive airway pressure (CPAP)   Z99.89      Kayvion was previously doing very well on CPAP therapy, however, over the past 6 months has had some difficulty. He has not been able to get Humira covered and reports worsening of psoriasis. Areas effected are at right ear and right parietal scalp where headgear sits. He does not wish to change mask or headgear. He is hopeful that dermatology is going to be able to get Humira approved. He has follow up next month. He has had his cracked tooth pulled and this has helped with dental pain. He wishes to resume CPAP therapy. He does have abnormal ESS scores with previous follow up on CPAP and worsening of scores, today. We will restart modafinil 200mg  daily. I have encouraged him to monitor blood pressures closely at home.  He was encouraged to continue using CPAP nightly and for greater than 4 hours each night.  Healthy lifestyle habits encouraged with adequate hydration, well-balanced diet and regular exercise.  He will continue close follow-up with primary care for comorbidity management.  He will follow-up with me in 3  months, sooner if needed.  He verbalizes understanding and agreement with this plan.   Orders Placed This Encounter  Procedures  . For home use only DME continuous positive airway pressure (CPAP)    Supplies    Order Specific Question:   Length of Need    Answer:   Lifetime    Order Specific Question:   Patient has OSA or probable OSA    Answer:   Yes    Order Specific Question:   Is the patient currently using CPAP in the home    Answer:   Yes    Order Specific Question:   Settings    Answer:   Other see comments    Order Specific Question:   CPAP supplies needed    Answer:   Mask, headgear, cushions, filters, heated tubing and water chamber     Meds ordered this encounter  Medications  . modafinil (PROVIGIL) 200 MG tablet    Sig: TAKE 1 TABLET BY MOUTH ONCE DAILY FOR ALERTNESS    Dispense:  30 tablet    Refill:  2    Order Specific Question:   Supervising Provider    Answer:   Anson Fret [id number]      I spent 15 minutes with the patient. 50% of this time was spent counseling and educating patient on plan of care and medications.    Shawnie Dapper, FNP-C 01/12/2021, 10:06 AM [facility] [address], [address] [place], [place] [address] [phone]

## 2021-01-08 NOTE — Patient Instructions (Addendum)
Please continue using your CPAP regularly. While your insurance requires that you use CPAP at least 4 hours each night on 70% of the nights, I recommend, that you not skip any nights and use it throughout the night if you can. Getting used to CPAP and staying with the treatment long term does take time and patience and discipline. Untreated obstructive sleep apnea when it is moderate to severe can have an adverse impact on cardiovascular health and raise her risk for heart disease, arrhythmias, hypertension, congestive heart failure, stroke and diabetes. Untreated obstructive sleep apnea causes sleep disruption, nonrestorative sleep, and sleep deprivation. This can have an impact on your day to day functioning and cause daytime sleepiness and impairment of cognitive function, memory loss, mood disturbance, and problems focussing. Using CPAP regularly can improve these symptoms.  Please work on resuming CPAP usage daily and for at least 4 hours. I will discuss restarting modafinil with Dr Vickey Huger. Please continue to monitor your blood pressures at home. Continue close follow up with PCP and dermatology. Consider barrier fabric under areas of headgear at areas of skin irritation.   Follow up in 3 months   Sleep Apnea Sleep apnea affects breathing during sleep. It causes breathing to stop for a short time or to become shallow. It can also increase the risk of:  Heart attack.  Stroke.  Being very overweight (obese).  Diabetes.  Heart failure.  Irregular heartbeat. The goal of treatment is to help you breathe normally again. What are the causes? There are three kinds of sleep apnea:  Obstructive sleep apnea. This is caused by a blocked or collapsed airway.  Central sleep apnea. This happens when the brain does not send the right signals to the muscles that control breathing.  Mixed sleep apnea. This is a combination of obstructive and central sleep apnea. The most common cause of this  condition is a collapsed or blocked airway. This can happen if:  Your throat muscles are too relaxed.  Your tongue and tonsils are too large.  You are overweight.  Your airway is too small.   What increases the risk?  Being overweight.  Smoking.  Having a small airway.  Being older.  Being male.  Drinking alcohol.  Taking medicines to calm yourself (sedatives or tranquilizers).  Having family members with the condition. What are the signs or symptoms?  Trouble staying asleep.  Being sleepy or tired during the day.  Getting angry a lot.  Loud snoring.  Headaches in the morning.  Not being able to focus your mind (concentrate).  Forgetting things.  Less interest in sex.  Mood swings.  Personality changes.  Feelings of sadness (depression).  Waking up a lot during the night to pee (urinate).  Dry mouth.  Sore throat. How is this diagnosed?  Your medical history.  A physical exam.  A test that is done when you are sleeping (sleep study). The test is most often done in a sleep lab but may also be done at home. How is this treated?  Sleeping on your side.  Using a medicine to get rid of mucus in your nose (decongestant).  Avoiding the use of alcohol, medicines to help you relax, or certain pain medicines (narcotics).  Losing weight, if needed.  Changing your diet.  Not smoking.  Using a machine to open your airway while you sleep, such as: ? An oral appliance. This is a mouthpiece that shifts your lower jaw forward. ? A CPAP device. This device blows  air through a mask when you breathe out (exhale). ? An EPAP device. This has valves that you put in each nostril. ? A BPAP device. This device blows air through a mask when you breathe in (inhale) and breathe out.  Having surgery if other treatments do not work. It is important to get treatment for sleep apnea. Without treatment, it can lead to:  High blood pressure.  Coronary artery  disease.  In men, not being able to have an erection (impotence).  Reduced thinking ability.   Follow these instructions at home: Lifestyle  Make changes that your doctor recommends.  Eat a healthy diet.  Lose weight if needed.  Avoid alcohol, medicines to help you relax, and some pain medicines.  Do not use any products that contain nicotine or tobacco, such as cigarettes, e-cigarettes, and chewing tobacco. If you need help quitting, ask your doctor. General instructions  Take over-the-counter and prescription medicines only as told by your doctor.  If you were given a machine to use while you sleep, use it only as told by your doctor.  If you are having surgery, make sure to tell your doctor you have sleep apnea. You may need to bring your device with you.  Keep all follow-up visits as told by your doctor. This is important. Contact a doctor if:  The machine that you were given to use during sleep bothers you or does not seem to be working.  You do not get better.  You get worse. Get help right away if:  Your chest hurts.  You have trouble breathing in enough air.  You have an uncomfortable feeling in your back, arms, or stomach.  You have trouble talking.  One side of your body feels weak.  A part of your face is hanging down. These symptoms may be an emergency. Do not wait to see if the symptoms will go away. Get medical help right away. Call your local emergency services (911 in the [place] Do not drive yourself to the hospital. Summary  This condition affects breathing during sleep.  The most common cause is a collapsed or blocked airway.  The goal of treatment is to help you breathe normally while you sleep. This information is not intended to replace advice given to you by your health care provider. Make sure you discuss any questions you have with your health care provider. Document Revised: 06/23/2018 Document Reviewed: 05/02/2018 Elsevier Patient  Education  2021 [employer].

## 2021-01-12 ENCOUNTER — Encounter: Payer: Self-pay | Admitting: Family Medicine

## 2021-01-12 ENCOUNTER — Ambulatory Visit: Payer: Managed Care, Other (non HMO) | Admitting: Family Medicine

## 2021-01-12 ENCOUNTER — Telehealth: Payer: Self-pay

## 2021-01-12 VITALS — BP 147/88 | HR 94 | Ht 70.0 in | Wt 247.0 lb

## 2021-01-12 DIAGNOSIS — Z9989 Dependence on other enabling machines and devices: Secondary | ICD-10-CM | POA: Diagnosis not present

## 2021-01-12 DIAGNOSIS — G4733 Obstructive sleep apnea (adult) (pediatric): Secondary | ICD-10-CM

## 2021-01-12 MED ORDER — MODAFINIL 200 MG PO TABS: ORAL_TABLET | ORAL | 2 refills | Status: DC

## 2021-01-12 NOTE — Progress Notes (Signed)
Cm sent to [facility] 

## 2021-01-12 NOTE — Telephone Encounter (Signed)
I submitted a PA request for Modafinil on CMM, Key: [id number] - PA Case ID: [id number].  [facility] has APPROVED medication, Coverage Start Date:01/12/2021;Coverage End Date:01/12/2022;

## 2021-04-09 NOTE — Progress Notes (Signed)
PATIENT: Jonathan Mcfarland DOB: 04-Apr-1971  REASON FOR VISIT: follow up HISTORY FROM: patient  Chief Complaint  Patient presents with   Obstructive Sleep Apnea    Rm 1, alone. Here today for cpap fu/, pt reports doing well, no issues or concerns.       HISTORY OF PRESENT ILLNESS: 04/13/21 ALL: Jonathan Mcfarland returns for CPAP follow up. He is doing well on therapy. He has resumed daily and 4 hour usage. He is now back on Humira and psoriasis is well managed. He deneis concerns with rash or sin irritation. He does feel that velcro straps need to be replaced more frequently and is working with [facility] to address. He is feeling fairly well, today. 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 He feels that modafinil is not as effective as it used to be. He admits that diet changes could be made as he loves carbohydrates. He previously took phentermine for weight loss and felt it was very effective for EDS. He has never tried armodafinil.       He returns for follow up for OSA on CPAP. He has had some difficulty with compliance due to a couple of medical conditions over the past few months. He had a cracked tooth a few months ago that was recently pulled. He has psoriasis and reports that the mask and headgear aggravates his skin. He has tried multiple masks and headgear in the past that he felt were worse. He is happy with his full face mask and headgear. He was on Humira and doing well. Recently, insurance denied. He was previously taking modafinil for EDS. He reports that he has not taken this medication in over a year. Insurance requires that rx come from sleep medicine. He reports that he nods off during the day. He can not sit still for any period of time. BP usually runs 120-130/70-80. Last saw PCP in January, 2022.      01/03/2020 ALL:  Jonathan Mcfarland is a 50 y.o. male here today for follow up for OSA recently restarted on CPAP therapy.  Home sleep study revealed mild to  moderate OSA with total AHI of 16.8/h exacerbated by REM sleep with AHI of 39.5/h.  No prolonged hypoxemia.  AutoPap therapy was ordered.  Patient reports that he is doing fair.  He is currently using a DreamWear full face mask and feels that it is much more comfortable than his previous mask.  He denies any difficulty with his CPAP machine.  Compliance report dated 12/03/2019 through 01/01/2020 reveals that he used CPAP 30 of the past 30 days for compliance of 100%.  He used CPAP greater than 4 hours all 30 days for compliance 100%.  Average usage was 8 hours and 6 minutes.  Residual AHI was 1.4 on 5 to 15 cm of water and an EPR of 2.  There was no significant leak noted.    HISTORY: (copied from Dr Dohmeier's note on 02/19/2019)  HPI:  Jonathan Mcfarland is a 50 y.o. male pateient is seen here as a referral  from PA [employer].     Medical history: diabetes, obesity, HTN,      Family history : nobody else sing CPAP, patient was adopted.    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  Adopted, Married, with step children ( daughter 26, son is 34). Ex smoker, quit in 2001.   ETOH - social occasion.  Caffeine : uses soda - interferes with DM, HbA1c. Diet sodas. Non caffeineated.  Sweet tea 2 weekly.  Coffee - only in winter.    Sleep habits-  Dinner time is 6-8 PM and he goes to bed by 9 Pm, at home watching TV I the den, not in bedroom  by 10- 11 PM.  Bedroom is cool, quiet and dark.  No trouble to sleep except when mask leaks air.  Sleeps for 2 hours at a time, frequent nocturia -( fasziglia), he accumulates usually a total sleep time of 4-5 hours nightly only.  Rises at 5.30 AM-  Wakes up non refreshed, not restored.    Review of Systems: Out of a complete 14 system review, the patient complains of only the following  symptoms, and all other reviewed systems are negative.   How likely are you to doze in the following situations: 0 = not likely, 1 = slight chance, 2 = moderate chance, 3 = high chance   Sitting and Reading?3  Watching Television? 2 Sitting inactive in a public place (theater or meeting)? 2 Lying down in the afternoon when circumstances permit? 3 Sitting and talking to someone?1 Sitting quietly after lunch without alcohol? 3 In a car, while stopped for a few minutes in traffic?1  As a passenger in a car for an hour without a break? 3   Total = 18   / 24 points. FSS at 22/ 63 points.     REVIEW OF SYSTEMS: Out of a complete 14 system review of symptoms, the patient complains only of the following symptoms, fatigue and all other reviewed systems are negative.  ESS:17, previously 17 FSS: 32, previously 28   ALLERGIES: Allergies  Allergen Reactions   Ciprofloxacin Itching   Percocet [Oxycodone-Acetaminophen]     HOME MEDICATIONS: Outpatient Medications Prior to Visit  Medication Sig Dispense Refill   albuterol (VENTOLIN HFA) 108 (90 Base) MCG/ACT inhaler Inhale 1-2 puffs into the lungs every 6 (six) hours as needed for wheezing or shortness of breath.     FARXIGA 10 MG TABS tablet TAKE 1 TABLET BY MOUTH IN THE MORNING FOR DIABETES     glipiZIDE (GLUCOTROL XL) 10 MG 24 hr tablet TAKE 1 TABLET BY MOUTH TWICE DAILY FOR DIABETES     HUMIRA PEN 40 MG/0.4ML PNKT      JANUVIA 100 MG tablet      meloxicam (MOBIC) 15 MG tablet Take 15 mg by mouth daily.     rosuvastatin (CRESTOR) 5 MG tablet 5 mg daily.     testosterone cypionate (DEPOTESTOSTERONE CYPIONATE) 200 MG/ML injection INJECT 2 ML INTRAMUSCULARLY EVERY TWO WEEKS     modafinil (PROVIGIL) 200 MG tablet TAKE 1 TABLET BY MOUTH ONCE DAILY FOR ALERTNESS 30 tablet 2   No facility-administered medications prior to visit.    PAST MEDICAL HISTORY: Past Medical History:  Diagnosis Date   Asthma    Chest pain    Abnormal treadmill,  2012  /  nuclear April 22, 2011 mild anterior ischemia   Diabetes mellitus without complication (HCC)    Diverticulosis    Ejection fraction    EF 55-60%, echo, July, 2012   GERD (gastroesophageal reflux disease)    Hyperlipidemia    Hypertension    OSA on CPAP    Reflux     PAST SURGICAL HISTORY: Past Surgical History:  Procedure  Laterality Date   INGUINAL HERNIA REPAIR  11/05/05    FAMILY HISTORY: Family History  Adopted: Yes  Problem Relation Age of Onset   Heart attack Maternal Uncle 54   Cancer Mother    Heart disease Mother     SOCIAL HISTORY: Social History   Socioeconomic History   Marital status: Married    Spouse name: Not on file   Number of children: 2   Years of education: Not on file   Highest education level: Not on file  Occupational History    Employer: [employer]  Tobacco Use   Smoking status: Former   Smokeless tobacco: Current    Types: Chew  Substance and Sexual Activity   Alcohol use: Yes    Comment: occasionally   Drug use: No   Sexual activity: Not on file  Other Topics Concern   Not on file  Social History Narrative   Not on file   Social Determinants of Health   Financial Resource Strain: Not on file  Food Insecurity: Not on file  Transportation Needs: Not on file  Physical Activity: Not on file  Stress: Not on file  Social Connections: Not on file  Intimate Partner Violence: Not on file      PHYSICAL EXAM  Vitals:   04/13/21 0729  BP: 136/87  Pulse: 79  Weight: 248 lb 8 oz (112.7 kg)  Height: 5\' 10"  (1.778 m)    Body mass index is 35.66 kg/m.  Generalized: Well developed, in no acute distress  Cardiology: normal rate and rhythm, no murmur noted Respiratory: clear to auscultation bilaterally  Neurological examination  Mentation: Alert oriented to time, place, history taking. Follows all commands speech and language fluent Cranial nerve II-XII: Pupils were equal round reactive to light. Extraocular  movements were full, visual field were full  Motor: The motor testing reveals 5 over 5 strength of all 4 extremities. Good symmetric motor tone is noted throughout.  Gait and station: Gait is normal.   DIAGNOSTIC DATA (LABS, IMAGING, TESTING) - I reviewed patient records, labs, notes, testing and imaging myself where available.  No flowsheet data found.   Lab Results  Component Value Date   WBC 10.8 (H) 05/10/2011   HGB 14.7 05/10/2011   HCT 44.1 05/10/2011   MCV 83.4 05/10/2011   PLT 228.0 05/10/2011      Component Value Date/Time   NA 138 05/10/2011 1223   K 3.7 05/10/2011 1223   CL 102 05/10/2011 1223   CO2 24 05/10/2011 1223   GLUCOSE 95 05/10/2011 1223   BUN 14 05/10/2011 1223   CREATININE 1.0 05/10/2011 1223   CALCIUM 8.9 05/10/2011 1223   No results found for: CHOL, HDL, LDLCALC, LDLDIRECT, TRIG, CHOLHDL No results found for: 05/12/2011 No results found for: VITAMINB12 No results found for: TSH     ASSESSMENT AND PLAN 50 y.o. year old male  has a past medical history of Asthma, Chest pain, Diabetes mellitus without complication (HCC), Diverticulosis, Ejection fraction, GERD (gastroesophageal reflux disease), Hyperlipidemia, Hypertension, OSA on CPAP, and Reflux. here with     ICD-10-CM   1. OSA on CPAP  G47.33 For home use only DME continuous positive airway pressure (CPAP)   Z99.89     2. Excessive daytime sleepiness  G47.19 Armodafinil 250 MG tablet        Nayib is doing well on CPAP therapy. He was encouraged to continue using CPAP nightly and for greater than 4 hours each night.  Healthy lifestyle habits  encouraged with adequate hydration, well-balanced diet and regular exercise. We will switch him to armodafinil 250mg  daily as modafinil is not as effective. He will call me with any concerns. He will continue close follow-up with primary care for comorbidity management.  He will follow-up with me in 6 months, sooner if needed.  He verbalizes understanding and  agreement with this plan.   Orders Placed This Encounter  Procedures   For home use only DME continuous positive airway pressure (CPAP)    Supplies    Order Specific Question:   Length of Need    Answer:   Lifetime    Order Specific Question:   Patient has OSA or probable OSA    Answer:   Yes    Order Specific Question:   Is the patient currently using CPAP in the home    Answer:   Yes    Order Specific Question:   Settings    Answer:   Other see comments    Order Specific Question:   CPAP supplies needed    Answer:   Mask, headgear, cushions, filters, heated tubing and water chamber      Meds ordered this encounter  Medications   Armodafinil 250 MG tablet    Sig: Take 1 tablet (250 mg total) by mouth daily.    Dispense:  90 tablet    Refill:  1    Order Specific Question:   Supervising Provider    Answer:   Anson FretHERN, ANTONIA B [[id number]]       JYN WGNFA[[id number]]       Lawrnce Reyez, FNP-C 04/13/2021, 7:59 AM [facility] [address], [address] [place], [place] [id number] [phone]

## 2021-04-09 NOTE — Patient Instructions (Addendum)
Please continue using your CPAP regularly. While your insurance requires that you use CPAP at least 4 hours each night on 70% of the nights, I recommend, that you not skip any nights and use it throughout the night if you can. Getting used to CPAP and staying with the treatment long term does take time and patience and discipline. Untreated obstructive sleep apnea when it is moderate to severe can have an adverse impact on cardiovascular health and raise her risk for heart disease, arrhythmias, hypertension, congestive heart failure, stroke and diabetes. Untreated obstructive sleep apnea causes sleep disruption, nonrestorative sleep, and sleep deprivation. This can have an impact on your day to day functioning and cause daytime sleepiness and impairment of cognitive function, memory loss, mood disturbance, and problems focussing. Using CPAP regularly can improve these symptoms.   We will try armodafinil in place of modafinil for EDS. Please take 1 tablet every day. Continue to work on healthy lifestyle habits.   Follow up in 6 months

## 2021-04-13 ENCOUNTER — Encounter: Payer: Self-pay | Admitting: Family Medicine

## 2021-04-13 ENCOUNTER — Other Ambulatory Visit: Payer: Self-pay

## 2021-04-13 ENCOUNTER — Ambulatory Visit: Payer: Managed Care, Other (non HMO) | Admitting: Family Medicine

## 2021-04-13 VITALS — BP 136/87 | HR 79 | Ht 70.0 in | Wt 248.5 lb

## 2021-04-13 DIAGNOSIS — G4733 Obstructive sleep apnea (adult) (pediatric): Secondary | ICD-10-CM

## 2021-04-13 DIAGNOSIS — G4719 Other hypersomnia: Secondary | ICD-10-CM | POA: Diagnosis not present

## 2021-04-13 DIAGNOSIS — Z9989 Dependence on other enabling machines and devices: Secondary | ICD-10-CM | POA: Diagnosis not present

## 2021-04-13 MED ORDER — ARMODAFINIL 250 MG PO TABS: 250.0000 mg | ORAL_TABLET | Freq: Every day | ORAL | 1 refills | Status: DC

## 2021-04-14 ENCOUNTER — Telehealth: Payer: Self-pay | Admitting: Neurology

## 2021-04-14 NOTE — Telephone Encounter (Signed)
PA completed on [facility] KEY:[id number] Approved immediately until 04/14/2022 Case ID: [id number]

## 2021-10-15 NOTE — Patient Instructions (Addendum)
Please continue using your CPAP regularly. While your insurance requires that you use CPAP at least 4 hours each night on 70% of the nights, I recommend, that you not skip any nights and use it throughout the night if you can. Getting used to CPAP and staying with the treatment long term does take time and patience and discipline. Untreated obstructive sleep apnea when it is moderate to severe can have an adverse impact on cardiovascular health and raise her risk for heart disease, arrhythmias, hypertension, congestive heart failure, stroke and diabetes. Untreated obstructive sleep apnea causes sleep disruption, nonrestorative sleep, and sleep deprivation. This can have an impact on your day to day functioning and cause daytime sleepiness and impairment of cognitive function, memory loss, mood disturbance, and problems focussing. Using CPAP regularly can improve these symptoms.  Continue working on CPAP usage. Continue working on [employer] and try to get regular exercise.   Follow up in 6 months

## 2021-10-15 NOTE — Progress Notes (Signed)
PATIENT: Jonathan Mcfarland DOB: 1971/08/14  REASON FOR VISIT: follow up HISTORY FROM: patient  Chief Complaint  Patient presents with   Follow-up    Pt alone, rm 10. Pt states that he works out of town a lot and so he has forgotten his machine.  DME [employer]. He has had problems with being able to get the head strap pc from the company. He has a old machine that still works and would like to know if ok to use that when he travels.       HISTORY OF PRESENT ILLNESS: 10/19/21 ALL: Yehonatan returns for follow up for OSA on CPAP and EDS. We switched him from modafinil to armodafinil at last visit 03/2021. He does feel that it is helping more so than modafinil. He continues to have daytime sleepiness, especially after lunch. He is trying to cut back on carbs. He is working out of town. Now staying in a camper when working. He admits that he forgets to use his CPAP. He is using his old CPAP when he travels.      04/13/2021 ALL:  Laban Emperor returns for CPAP follow up. He is doing well on therapy. He has resumed daily and 4 hour usage. He is now back on Humira and psoriasis is well managed. He deneis concerns with rash or sin irritation. He does feel that velcro straps need to be replaced more frequently and is working with [employer] to address. He is feeling fairly well, today. 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 He feels that modafinil is not as effective as it used to be. He admits that diet changes could be made as he loves carbohydrates. He previously took phentermine for weight loss and felt it was very effective for EDS. He has never tried armodafinil.      He returns for follow up for OSA on CPAP. He has had some difficulty with compliance due to a couple of medical conditions over the past few months. He had a cracked tooth a few months ago that was recently pulled. He has psoriasis and reports that the mask and headgear aggravates his skin. He has tried multiple masks and  headgear in the past that he felt were worse. He is happy with his full face mask and headgear. He was on Humira and doing well. Recently, insurance denied. He was previously taking modafinil for EDS. He reports that he has not taken this medication in over a year. Insurance requires that rx come from sleep medicine. He reports that he nods off during the day. He can not sit still for any period of time. BP usually runs 120-130/70-80. Last saw PCP in January, 2022.      01/03/2020 ALL:  GARY GABRIELSEN is a 51 y.o. male here today for follow up for OSA recently restarted on CPAP therapy.  Home sleep study revealed mild to moderate OSA with total AHI of 16.8/h exacerbated by REM sleep with AHI of 39.5/h.  No prolonged hypoxemia.  AutoPap therapy was ordered.  Patient reports that he is doing fair.  He is currently using a DreamWear full face mask and feels that it is much more comfortable than his previous mask.  He denies any difficulty with his CPAP machine.  Compliance report dated 12/03/2019 through 01/01/2020 reveals that he used CPAP 30 of the past 30 days for compliance of 100%.  He used CPAP greater than 4 hours all 30 days for compliance 100%.  Average usage  was 8 hours and 6 minutes.  Residual AHI was 1.4 on 5 to 15 cm of water and an EPR of 2.  There was no significant leak noted.   REVIEW OF SYSTEMS: Out of a complete 14 system review of symptoms, the patient complains only of the following symptoms, fatigue and all other reviewed systems are negative.  ESS:17, previously 17   ALLERGIES: Allergies  Allergen Reactions   Ciprofloxacin Itching   Percocet [Oxycodone-Acetaminophen]     HOME MEDICATIONS: Outpatient Medications Prior to Visit  Medication Sig Dispense Refill   albuterol (VENTOLIN HFA) 108 (90 Base) MCG/ACT inhaler Inhale 1-2 puffs into the lungs every 6 (six) hours as needed for wheezing or shortness of breath.     FARXIGA 10 MG TABS tablet TAKE 1 TABLET BY MOUTH IN THE  MORNING FOR DIABETES     glipiZIDE (GLUCOTROL XL) 10 MG 24 hr tablet TAKE 1 TABLET BY MOUTH TWICE DAILY FOR DIABETES     HUMIRA PEN 40 MG/0.4ML PNKT      JANUVIA 100 MG tablet      meloxicam (MOBIC) 15 MG tablet Take 15 mg by mouth daily.     rosuvastatin (CRESTOR) 5 MG tablet 5 mg daily.     testosterone cypionate (DEPOTESTOSTERONE CYPIONATE) 200 MG/ML injection INJECT 2 ML INTRAMUSCULARLY EVERY TWO WEEKS     Armodafinil 250 MG tablet Take 1 tablet (250 mg total) by mouth daily. 90 tablet 1   No facility-administered medications prior to visit.    PAST MEDICAL HISTORY: Past Medical History:  Diagnosis Date   Asthma    Chest pain    Abnormal treadmill, 2012  /  nuclear April 22, 2011 mild anterior ischemia   Diabetes mellitus without complication (HCC)    Diverticulosis    Ejection fraction    EF 55-60%, echo, July, 2012   GERD (gastroesophageal reflux disease)    Hyperlipidemia    Hypertension    OSA on CPAP    Reflux     PAST SURGICAL HISTORY: Past Surgical History:  Procedure Laterality Date   INGUINAL HERNIA REPAIR  11/05/05    FAMILY HISTORY: Family History  Adopted: Yes  Problem Relation Age of Onset   Heart attack Maternal Uncle [id number]   Cancer Mother    Heart disease Mother     SOCIAL HISTORY: Social History   Socioeconomic History   Marital status: Married    Spouse name: Not on file   Number of children: 2   Years of education: Not on file   Highest education level: Not on file  Occupational History    Employer: [employer]  Tobacco Use   Smoking status: Former   Smokeless tobacco: Current    Types: Chew  Substance and Sexual Activity   Alcohol use: Yes    Comment: occasionally   Drug use: No   Sexual activity: Not on file  Other Topics Concern   Not on file  Social History Narrative   Not on file   Social Determinants of Health   Financial Resource Strain: Not on file  Food Insecurity: Not on file  Transportation Needs: Not on file   Physical Activity: Not on file  Stress: Not on file  Social Connections: Not on file  Intimate Partner Violence: Not on file      PHYSICAL EXAM  Vitals:   10/19/21 0731  BP: (!) 148/88  Pulse: 86  Weight: 237 lb (107.5 kg)  Height: 5\' 10"  (1.778 m)  Body mass index is 34.01 kg/m.  Generalized: Well developed, in no acute distress  Cardiology: normal rate and rhythm, no murmur noted Respiratory: clear to auscultation bilaterally  Neurological examination  Mentation: Alert oriented to time, place, history taking. Follows all commands speech and language fluent Cranial nerve II-XII: Pupils were equal round reactive to light. Extraocular movements were full, visual field were full  Motor: The motor testing reveals 5 over 5 strength of all 4 extremities. Good symmetric motor tone is noted throughout.  Gait and station: Gait is normal.   DIAGNOSTIC DATA (LABS, IMAGING, TESTING) - I reviewed patient records, labs, notes, testing and imaging myself where available.  No flowsheet data found.   Lab Results  Component Value Date   WBC 10.8 (H) 05/10/2011   HGB 14.7 05/10/2011   HCT 44.1 05/10/2011   MCV 83.4 05/10/2011   PLT 228.0 05/10/2011      Component Value Date/Time   NA 138 05/10/2011 1223   K 3.7 05/10/2011 1223   CL 102 05/10/2011 1223   CO2 24 05/10/2011 1223   GLUCOSE 95 05/10/2011 1223   BUN 14 05/10/2011 1223   CREATININE 1.0 05/10/2011 1223   CALCIUM 8.9 05/10/2011 1223   No results found for: CHOL, HDL, LDLCALC, LDLDIRECT, TRIG, CHOLHDL No results found for: ZOXW9U No results found for: VITAMINB12 No results found for: TSH     ASSESSMENT AND PLAN 51 y.o. year old male  has a past medical history of Asthma, Chest pain, Diabetes mellitus without complication (HCC), Diverticulosis, Ejection fraction, GERD (gastroesophageal reflux disease), Hyperlipidemia, Hypertension, OSA on CPAP, and Reflux. here with     ICD-10-CM   1. OSA on CPAP  G47.33     Z99.89     2. Excessive daytime sleepiness  G47.19 Armodafinil 250 MG tablet      Ebb is doing well on CPAP therapy. Current report shows acceptable compliance, however, he is using his old machine when out of town and data not available for today's visit. He was encouraged to continue using CPAP nightly and for greater than 4 hours each night. He will continue armodafinil 250mg  daily. PDMP is appropriate.  Healthy lifestyle habits encouraged with adequate hydration, well-balanced diet and regular exercise. He will continue close follow-up with primary care for comorbidity management.  He will follow-up with me in 6 months, sooner if needed.  He verbalizes understanding and agreement with this plan.   No orders of the defined types were placed in this encounter.     Meds ordered this encounter  Medications   Armodafinil 250 MG tablet    Sig: Take 1 tablet (250 mg total) by mouth daily.    Dispense:  90 tablet    Refill:  1    Order Specific Question:   Supervising Provider    Answer:   , FNP-C 10/19/2021, 8:02 AM [facility] [address] [place], [place] [phone]

## 2021-10-19 ENCOUNTER — Ambulatory Visit: Payer: Managed Care, Other (non HMO) | Admitting: Family Medicine

## 2021-10-19 ENCOUNTER — Encounter: Payer: Self-pay | Admitting: Family Medicine

## 2021-10-19 VITALS — BP 148/88 | HR 86 | Ht 70.0 in | Wt 237.0 lb

## 2021-10-19 DIAGNOSIS — Z9989 Dependence on other enabling machines and devices: Secondary | ICD-10-CM

## 2021-10-19 DIAGNOSIS — G4733 Obstructive sleep apnea (adult) (pediatric): Secondary | ICD-10-CM

## 2021-10-19 DIAGNOSIS — G4719 Other hypersomnia: Secondary | ICD-10-CM | POA: Diagnosis not present

## 2021-10-19 MED ORDER — ARMODAFINIL 250 MG PO TABS: 250.0000 mg | ORAL_TABLET | Freq: Every day | ORAL | 1 refills | Status: DC

## 2022-01-15 ENCOUNTER — Encounter: Payer: Self-pay | Admitting: Family Medicine

## 2022-03-31 ENCOUNTER — Telehealth: Payer: Self-pay | Admitting: Neurology

## 2022-03-31 NOTE — Telephone Encounter (Signed)
PA completed on [facility] KEY: [id number] Approved immediately

## 2022-04-15 NOTE — Progress Notes (Signed)
PATIENT: Jonathan Mcfarland DOB: [birth year]  REASON FOR VISIT: follow up HISTORY FROM: patient  Chief Complaint  Patient presents with   Follow-up    Pt alone, rm 2. Presents for follow up for CPAP. Needs refill for armodafinil. Otherwise stable and no issues.     HISTORY OF PRESENT ILLNESS:  04/19/22 ALL: Jonathan Mcfarland returns for follow up for OSA on CPAP and EDS on armodafinil 250mg  QD. He feels that he is doing fairly well. He does feel that armodafinil helps but he continues to have difficulty with daytime sleepiness. ESS 17/24. He is able to work full time. He is exposed to heat daily which makes him very tired. He is drinking about a case of water a day. He rarely gets sleepy driving. He carries candy with him just in case. He is working on lowering A1C. Last A1C was around 6.1.     10/19/2021 ALL: Jonathan Mcfarland returns for follow up for OSA on CPAP and EDS. We switched him from modafinil to armodafinil at last visit 03/2021. He does feel that it is helping more so than modafinil. He continues to have daytime sleepiness, especially after lunch. He is trying to cut back on carbs. He is working out of town. Now staying in a camper when working. He admits that he forgets to use his CPAP. He is using his old CPAP when he travels.      04/13/2021 ALL:  04/15/2021 returns for CPAP follow up. He is doing well on therapy. He has resumed daily and 4 hour usage. He is now back on Humira and psoriasis is well managed. He deneis concerns with rash or sin irritation. He does feel that velcro straps need to be replaced more frequently and is working with [employer] to address. He is feeling fairly well, today. 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 He feels that modafinil is not as effective as it used to be. He admits that diet changes could be made as he loves carbohydrates. He previously took phentermine for weight loss and felt it was very effective for EDS. He has never tried armodafinil.       He returns for follow up for OSA on CPAP. He has had some difficulty with compliance due to a couple of medical conditions over the past few months. He had a cracked tooth a few months ago that was recently pulled. He has psoriasis and reports that the mask and headgear aggravates his skin. He has tried multiple masks and headgear in the past that he felt were worse. He is happy with his full face mask and headgear. He was on Humira and doing well. Recently, insurance denied. He was previously taking modafinil for EDS. He reports that he has not taken this medication in over a year. Insurance requires that rx come from sleep medicine. He reports that he nods off during the day. He can not sit still for any period of time. BP usually runs 120-130/70-80. Last saw PCP in January, 2022.      01/03/2020 ALL:  Jonathan Mcfarland is a 51 y.o. male here today for follow up for OSA recently restarted on CPAP therapy.  Home sleep study revealed mild to moderate OSA with total AHI of 16.8/h exacerbated by REM sleep with AHI of 39.5/h.  No prolonged hypoxemia.  AutoPap therapy was ordered.  Patient reports that he is doing fair.  He is currently using a DreamWear full face mask and feels that it is  much more comfortable than his previous mask.  He denies any difficulty with his CPAP machine.  Compliance report dated 12/03/2019 through 01/01/2020 reveals that he used CPAP 30 of the past 30 days for compliance of 100%.  He used CPAP greater than 4 hours all 30 days for compliance 100%.  Average usage was 8 hours and 6 minutes.  Residual AHI was 1.4 on 5 to 15 cm of water and an EPR of 2.  There was no significant leak noted.   REVIEW OF SYSTEMS: Out of a complete 14 system review of symptoms, the patient complains only of the following symptoms, fatigue and all other reviewed systems are negative.  ESS:17/24, previously 17/24   ALLERGIES: Allergies  Allergen Reactions   Ciprofloxacin Itching   Percocet  [Oxycodone-Acetaminophen]     HOME MEDICATIONS: Outpatient Medications Prior to Visit  Medication Sig Dispense Refill   albuterol (VENTOLIN HFA) 108 (90 Base) MCG/ACT inhaler Inhale 1-2 puffs into the lungs every 6 (six) hours as needed for wheezing or shortness of breath.     Armodafinil 250 MG tablet Take 1 tablet (250 mg total) by mouth daily. 90 tablet 1   FARXIGA 10 MG TABS tablet TAKE 1 TABLET BY MOUTH IN THE MORNING FOR DIABETES     HUMIRA PEN 40 MG/0.4ML PNKT Inject 40 mg into the skin every 14 (fourteen) days.     JANUVIA 100 MG tablet      meloxicam (MOBIC) 15 MG tablet Take 15 mg by mouth daily.     rosuvastatin (CRESTOR) 5 MG tablet 5 mg daily.     testosterone cypionate (DEPOTESTOSTERONE CYPIONATE) 200 MG/ML injection INJECT 2 ML INTRAMUSCULARLY EVERY TWO WEEKS     glipiZIDE (GLUCOTROL XL) 10 MG 24 hr tablet TAKE 1 TABLET BY MOUTH TWICE DAILY FOR DIABETES     No facility-administered medications prior to visit.    PAST MEDICAL HISTORY: Past Medical History:  Diagnosis Date   Asthma    Chest pain    Abnormal treadmill, 2012  /  nuclear April 22, 2011 mild anterior ischemia   Diabetes mellitus without complication (HCC)    Diverticulosis    Ejection fraction    EF 55-60%, echo, July, 2012   GERD (gastroesophageal reflux disease)    Hyperlipidemia    Hypertension    OSA on CPAP    Reflux     PAST SURGICAL HISTORY: Past Surgical History:  Procedure Laterality Date   INGUINAL HERNIA REPAIR  11/05/05    FAMILY HISTORY: Family History  Adopted: Yes  Problem Relation Age of Onset   Heart attack Maternal Uncle 50   Cancer Mother    Heart disease Mother     SOCIAL HISTORY: Social History   Socioeconomic History   Marital status: Married    Spouse name: Not on file   Number of children: 2   Years of education: Not on file   Highest education level: Not on file  Occupational History    Employer: [employer]  Tobacco Use   Smoking status: Former    Smokeless tobacco: Current    Types: Chew  Substance and Sexual Activity   Alcohol use: Yes    Comment: occasionally   Drug use: No   Sexual activity: Not on file  Other Topics Concern   Not on file  Social History Narrative   Not on file   Social Determinants of Health   Financial Resource Strain: Not on file  Food Insecurity: Not on file  Transportation  Needs: Not on file  Physical Activity: Not on file  Stress: Not on file  Social Connections: Not on file  Intimate Partner Violence: Not on file      PHYSICAL EXAM  Vitals:   04/19/22 0728  BP: (!) 144/91  Pulse: 72  Weight: 232 lb (105.2 kg)  Height: 5\' 10"  (1.778 m)      Body mass index is 33.29 kg/m.  Generalized: Well developed, in no acute distress  Cardiology: normal rate and rhythm, no murmur noted Respiratory: clear to auscultation bilaterally  Neurological examination  Mentation: Alert oriented to time, place, history taking. Follows all commands speech and language fluent Cranial nerve II-XII: Pupils were equal round reactive to light. Extraocular movements were full, visual field were full  Motor: The motor testing reveals 5 over 5 strength of all 4 extremities. Good symmetric motor tone is noted throughout.  Gait and station: Gait is normal.   DIAGNOSTIC DATA (LABS, IMAGING, TESTING) - I reviewed patient records, labs, notes, testing and imaging myself where available.      No data to display           Lab Results  Component Value Date   WBC 10.8 (H) 05/10/2011   HGB 14.7 05/10/2011   HCT 44.1 05/10/2011   MCV 83.4 05/10/2011   PLT 228.0 05/10/2011      Component Value Date/Time   NA 138 05/10/2011 1223   K 3.7 05/10/2011 1223   CL 102 05/10/2011 1223   CO2 24 05/10/2011 1223   GLUCOSE 95 05/10/2011 1223   BUN 14 05/10/2011 1223   CREATININE 1.0 05/10/2011 1223   CALCIUM 8.9 05/10/2011 1223   No results found for: "CHOL", "HDL", "LDLCALC", "LDLDIRECT", "TRIG", "CHOLHDL" No  results found for: "HGBA1C" No results found for: "VITAMINB12" No results found for: "TSH"     ASSESSMENT AND PLAN 51 y.o. year old male  has a past medical history of Asthma, Chest pain, Diabetes mellitus without complication (HCC), Diverticulosis, Ejection fraction, GERD (gastroesophageal reflux disease), Hyperlipidemia, Hypertension, OSA on CPAP, and Reflux. here with     ICD-10-CM   1. OSA on CPAP  G47.33 For home use only DME continuous positive airway pressure (CPAP)   Z99.89     2. Excessive daytime sleepiness  G47.19       Bertrum is doing well on CPAP therapy. Current report shows excellent compliance. He was encouraged to continue using CPAP nightly and for greater than 4 hours each night. He continues to have EDS on armodafinil 250mg  daily. He has failed modafinil. We will try Sunosi 75mg  daily. May increase dose to 150mg  daily if well tolerated. PDMP is appropriate. Healthy lifestyle habits encouraged with adequate hydration, well-balanced diet and regular exercise. He will continue close follow-up with primary care for comorbidity management.  He will follow-up with me in 6 months, sooner if needed.  He verbalizes understanding and agreement with this plan.   Orders Placed This Encounter  Procedures   For home use only DME continuous positive airway pressure (CPAP)    Supplies    Order Specific Question:   Length of Need    Answer:   Lifetime    Order Specific Question:   Patient has OSA or probable OSA    Answer:   Yes    Order Specific Question:   Is the patient currently using CPAP in the home    Answer:   Yes    Order Specific Question:   Settings  Answer:   Other see comments    Order Specific Question:   CPAP supplies needed    Answer:   Mask, headgear, cushions, filters, heated tubing and water chamber      Meds ordered this encounter  Medications   Solriamfetol HCl (SUNOSI) 75 MG TABS    Sig: Take 75 mg by mouth daily.    Dispense:  90 tablet     Refill:  1    Order Specific Question:   Supervising Provider    Answer:   Bernestine Amass, FNP-C 04/19/2022, 7:49 AM [facility] [address] [place], [place] [address] [phone]

## 2022-04-15 NOTE — Patient Instructions (Addendum)
Please continue using your CPAP regularly. While your insurance requires that you use CPAP at least 4 hours each night on 70% of the nights, I recommend, that you not skip any nights and use it throughout the night if you can. Getting used to CPAP and staying with the treatment long term does take time and patience and discipline. Untreated obstructive sleep apnea when it is moderate to severe can have an adverse impact on cardiovascular health and raise her risk for heart disease, arrhythmias, hypertension, congestive heart failure, stroke and diabetes. Untreated obstructive sleep apnea causes sleep disruption, nonrestorative sleep, and sleep deprivation. This can have an impact on your day to day functioning and cause daytime sleepiness and impairment of cognitive function, memory loss, mood disturbance, and problems focussing. Using CPAP regularly can improve these symptoms.  We will try Sunosi 75mg  daily. If well tolerated we can go up to 150mg  daily.   Follow up in 6 months

## 2022-04-19 ENCOUNTER — Ambulatory Visit: Payer: Managed Care, Other (non HMO) | Admitting: Family Medicine

## 2022-04-19 ENCOUNTER — Encounter: Payer: Self-pay | Admitting: Family Medicine

## 2022-04-19 VITALS — BP 144/91 | HR 72 | Ht 70.0 in | Wt 232.0 lb

## 2022-04-19 DIAGNOSIS — Z9989 Dependence on other enabling machines and devices: Secondary | ICD-10-CM | POA: Diagnosis not present

## 2022-04-19 DIAGNOSIS — G4733 Obstructive sleep apnea (adult) (pediatric): Secondary | ICD-10-CM | POA: Diagnosis not present

## 2022-04-19 DIAGNOSIS — G4719 Other hypersomnia: Secondary | ICD-10-CM | POA: Diagnosis not present

## 2022-04-19 MED ORDER — SUNOSI 75 MG PO TABS: 75.0000 mg | ORAL_TABLET | Freq: Every day | ORAL | 1 refills | Status: DC

## 2022-04-20 NOTE — Progress Notes (Signed)
CM sent to [facility] for new order  Per Ena Dawley, "Pt account is on hold for a past due balance greater than $1000.00".   She will call and inform pt of balance.

## 2022-08-18 ENCOUNTER — Telehealth: Payer: Self-pay | Admitting: Family Medicine

## 2022-08-18 NOTE — Telephone Encounter (Signed)
Pt called stating that when he went to pick up his Solriamfetol HCl (SUNOSI) 75 MG TABS the pharmacy informed him that it would be over $100 due to it not having a P.A. Pt would like to discuss.

## 2022-08-18 NOTE — Telephone Encounter (Signed)
"  CVELFY:[id number];HENIDP:[id number];Review Type:Prior Auth;Coverage Start Date:08/18/2022;Coverage End Date:08/18/2023;"  I called pt and advised PA approved and should contact pharmacy to re-run rx. He verbalized understanding.  They gave hi 15 days supply for 9.00 since he was out.

## 2022-08-18 NOTE — Telephone Encounter (Signed)
Submitted PA on CMM. Key: [id number]. Waiting on determination from [place], marked urgent.

## 2022-10-18 ENCOUNTER — Other Ambulatory Visit: Payer: Self-pay | Admitting: *Deleted

## 2022-10-18 MED ORDER — SUNOSI 75 MG PO TABS: 75.0000 mg | ORAL_TABLET | Freq: Every day | ORAL | 0 refills | Status: DC

## 2022-10-18 NOTE — Telephone Encounter (Addendum)
Patient scheduled for follow up on 11/08/22 Per drug register last filled on 10/01/22 #15 tablets, 09/14/22 #15 tablet, 09/27/21 #15 tablets, 08/11/22 # 15 tablets  Rx pending to be signed to be sent to pharmacy.

## 2022-11-04 NOTE — Patient Instructions (Addendum)
Please continue using your CPAP regularly. While your insurance requires that you use CPAP at least 4 hours each night on 70% of the nights, I recommend, that you not skip any nights and use it throughout the night if you can. Getting used to CPAP and staying with the treatment long term does take time and patience and discipline. Untreated obstructive sleep apnea when it is moderate to severe can have an adverse impact on cardiovascular health and raise her risk for heart disease, arrhythmias, hypertension, congestive heart failure, stroke and diabetes. Untreated obstructive sleep apnea causes sleep disruption, nonrestorative sleep, and sleep deprivation. This can have an impact on your day to day functioning and cause daytime sleepiness and impairment of cognitive function, memory loss, mood disturbance, and problems focussing. Using CPAP regularly can improve these symptoms.  We will increase Sunosi dose to 157m daily. Please let me know if you are not able to tolerate increased dose. Try to take it early in the mornings to avoid insomnia.   Please follow up in 6 months

## 2022-11-04 NOTE — Progress Notes (Signed)
PATIENT: Jonathan Mcfarland DOB: [birth year]  REASON FOR VISIT: follow up HISTORY FROM: patient  Chief Complaint  Patient presents with   Follow-up    Pt in room 2 here for cpap follow up. Pt would like to increase Sunosi, pt reports doing well on cpap.     HISTORY OF PRESENT ILLNESS:  11/08/22 ALL: [name] returns for follow up for OSA on CPAP and EDS on Sunosi. He reports Sunosi has helped a little better than modafinil and armodafinil. He continues to have some daytime sleepiness. He is sleeping about 8 hours, on average. He goes to bed around 8:30-9pm and wakes around 5am. He denies concerns of insomnia. He is using CPAP nightly for about 7 hours. He denies concerns with machine or supplies. He recently started Ozempic and has been losing weight. CBGs are significantly improved.     04/19/2022 ALL: [name] returns for follow up for OSA on CPAP and EDS on armodafinil 253m QD. He feels that he is doing fairly well. He does feel that armodafinil helps but he continues to have difficulty with daytime sleepiness. ESS 17/24. He is able to work full time. He is exposed to heat daily which makes him very tired. He is drinking about a case of water a day. He rarely gets sleepy driving. He carries candy with him just in case. He is working on lowering A1C. Last A1C was around 6.1.     10/19/2021 ALL: [name] returns for follow up for OSA on CPAP and EDS. We switched him from modafinil to armodafinil at last visit 03/2021. He does feel that it is helping more so than modafinil. He continues to have daytime sleepiness, especially after lunch. He is trying to cut back on carbs. He is working out of town. Now staying in a camper when working. He admits that he forgets to use his CPAP. He is using his old CPAP when he travels.      04/13/2021 ALL:  DMarguerite Oleareturns for CPAP follow up. He is doing well on therapy. He has resumed daily and 4 hour usage. He is now back on Humira and psoriasis is well  managed. He deneis concerns with rash or sin irritation. He does feel that velcro straps need to be replaced more frequently and is working with [facility] to address. He is feeling fairly well, today. 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 He feels that modafinil is not as effective as it used to be. He admits that diet changes could be made as he loves carbohydrates. He previously took phentermine for weight loss and felt it was very effective for EDS. He has never tried armodafinil.     01/12/2021 ALL: He returns for follow up for OSA on CPAP. He has had some difficulty with compliance due to a couple of medical conditions over the past few months. He had a cracked tooth a few months ago that was recently pulled. He has psoriasis and reports that the mask and headgear aggravates his skin. He has tried multiple masks and headgear in the past that he felt were worse. He is happy with his full face mask and headgear. He was on Humira and doing well. Recently, insurance denied. He was previously taking modafinil for EDS. He reports that he has not taken this medication in over a year. Insurance requires that rx come from sleep medicine. He reports that he nods off during the day. He can not sit still for any period of  time. BP usually runs 120-130/70-80. Last saw PCP in January, 2022.      01/03/2020 ALL:  [name] is a 52 y.o. male here today for follow up for OSA recently restarted on CPAP therapy.  Home sleep study revealed mild to moderate OSA with total AHI of 16.8/h exacerbated by REM sleep with AHI of 39.5/h.  No prolonged hypoxemia.  AutoPap therapy was ordered.  Patient reports that he is doing fair.  He is currently using a DreamWear full face mask and feels that it is much more comfortable than his previous mask.  He denies any difficulty with his CPAP machine.  Compliance report dated 12/03/2019 through 01/01/2020 reveals that he used CPAP 30 of the past 30 days for  compliance of 100%.  He used CPAP greater than 4 hours all 30 days for compliance 100%.  Average usage was 8 hours and 6 minutes.  Residual AHI was 1.4 on 5 to 15 cm of water and an EPR of 2.  There was no significant leak noted.   REVIEW OF SYSTEMS: Out of a complete 14 system review of symptoms, the patient complains only of the following symptoms, fatigue and all other reviewed systems are negative.  ESS:13/24, previously 17/24   ALLERGIES: Allergies  Allergen Reactions   Ciprofloxacin Itching   Percocet [Oxycodone-Acetaminophen]     HOME MEDICATIONS: Outpatient Medications Prior to Visit  Medication Sig Dispense Refill   albuterol (VENTOLIN HFA) 108 (90 Base) MCG/ACT inhaler Inhale 1-2 puffs into the lungs every 6 (six) hours as needed for wheezing or shortness of breath.     HUMIRA PEN 40 MG/0.4ML PNKT Inject 40 mg into the skin every 14 (fourteen) days.     meloxicam (MOBIC) 15 MG tablet Take 15 mg by mouth daily.     rosuvastatin (CRESTOR) 5 MG tablet 5 mg daily.     Semaglutide, 1 MG/DOSE, (OZEMPIC, 1 MG/DOSE,) 4 MG/3ML SOPN Inject into the skin.     testosterone cypionate (DEPOTESTOSTERONE CYPIONATE) 200 MG/ML injection INJECT 2 ML INTRAMUSCULARLY EVERY TWO WEEKS     JANUVIA 100 MG tablet      Solriamfetol HCl (SUNOSI) 75 MG TABS Take 1 tablet (75 mg total) by mouth daily. 30 tablet 0   FARXIGA 10 MG TABS tablet TAKE 1 TABLET BY MOUTH IN THE MORNING FOR DIABETES     Armodafinil 250 MG tablet Take 1 tablet (250 mg total) by mouth daily. 90 tablet 1   No facility-administered medications prior to visit.    PAST MEDICAL HISTORY: Past Medical History:  Diagnosis Date   Asthma    Chest pain    Abnormal treadmill, 2012  /  nuclear April 22, 2011 mild anterior ischemia   Diabetes mellitus without complication ([place])    Diverticulosis    Ejection fraction    EF 55-60%, echo, July, 2012   GERD (gastroesophageal reflux disease)    Hyperlipidemia    Hypertension    OSA on  CPAP    Reflux     PAST SURGICAL HISTORY: Past Surgical History:  Procedure Laterality Date   INGUINAL HERNIA REPAIR  11/05/05    FAMILY HISTORY: Family History  Adopted: Yes  Problem Relation Age of Onset   Heart attack Maternal Uncle 71   Cancer Mother    Heart disease Mother     SOCIAL HISTORY: Social History   Socioeconomic History   Marital status: Married    Spouse name: Not on file   Number of children: 2  Years of education: Not on file   Highest education level: Not on file  Occupational History    Employer: [employer]  Tobacco Use   Smoking status: Former   Smokeless tobacco: Current    Types: Chew  Substance and Sexual Activity   Alcohol use: Yes    Comment: occasionally   Drug use: No   Sexual activity: Not on file  Other Topics Concern   Not on file  Social History Narrative   Not on file   Social Determinants of Health   Financial Resource Strain: Not on file  Food Insecurity: Not on file  Transportation Needs: Not on file  Physical Activity: Not on file  Stress: Not on file  Social Connections: Not on file  Intimate Partner Violence: Not on file      PHYSICAL EXAM  Vitals:   11/08/22 1029  BP: (!) 137/92  Pulse: 76  Weight: 234 lb 9.6 oz (106.4 kg)  Height: 5' 10"$  (1.778 m)       Body mass index is 33.66 kg/m.  Generalized: Well developed, in no acute distress  Cardiology: normal rate and rhythm, no murmur noted Respiratory: clear to auscultation bilaterally  Neurological examination  Mentation: Alert oriented to time, place, history taking. Follows all commands speech and language fluent Cranial nerve II-XII: Pupils were equal round reactive to light. Extraocular movements were full, visual field were full  Motor: The motor testing reveals 5 over 5 strength of all 4 extremities. Good symmetric motor tone is noted throughout.  Gait and station: Gait is normal.   DIAGNOSTIC DATA (LABS, IMAGING, TESTING) - I reviewed  patient records, labs, notes, testing and imaging myself where available.      No data to display           Lab Results  Component Value Date   WBC 10.8 (H) 05/10/2011   HGB 14.7 05/10/2011   HCT 44.1 05/10/2011   MCV 83.4 05/10/2011   PLT 228.0 05/10/2011      Component Value Date/Time   NA 138 05/10/2011 1223   K 3.7 05/10/2011 1223   CL 102 05/10/2011 1223   CO2 24 05/10/2011 1223   GLUCOSE 95 05/10/2011 1223   BUN 14 05/10/2011 1223   CREATININE 1.0 05/10/2011 1223   CALCIUM 8.9 05/10/2011 1223   No results found for: "CHOL", "HDL", "LDLCALC", "LDLDIRECT", "TRIG", "CHOLHDL" No results found for: "HGBA1C" No results found for: "VITAMINB12" No results found for: "TSH"     ASSESSMENT AND PLAN 52 y.o. year old male  has a past medical history of Asthma, Chest pain, Diabetes mellitus without complication ([place]), Diverticulosis, Ejection fraction, GERD (gastroesophageal reflux disease), Hyperlipidemia, Hypertension, OSA on CPAP, and Reflux. here with     ICD-10-CM   1. OSA on CPAP  G47.33     2. Excessive daytime sleepiness  G47.19       Manases is doing well on CPAP therapy. Current report shows excellent compliance. He was encouraged to continue using CPAP nightly and for greater than 4 hours each night. He continues to have EDS on Sunosi 55m daily. We will increase dose to 1561mdaily. PDMP is appropriate. Healthy lifestyle habits encouraged with adequate hydration, well-balanced diet and regular exercise. He will continue close follow-up with primary care for comorbidity management.  He will follow-up with me in 6 months, sooner if needed.  He verbalizes understanding and agreement with this plan.   No orders of the defined types were placed in this encounter.  Meds ordered this encounter  Medications   Solriamfetol HCl (SUNOSI) 150 MG TABS    Sig: Take 1 tablet (150 mg total) by mouth daily.    Dispense:  90 tablet    Refill:  1    Order Specific  Question:   Supervising Provider    Answer:   Bess Harvest, FNP-C 11/08/2022, 10:50 AM [facility] [address] [place], [place] [address] [phone]

## 2022-11-08 ENCOUNTER — Encounter: Payer: Self-pay | Admitting: Family Medicine

## 2022-11-08 ENCOUNTER — Ambulatory Visit: Payer: Managed Care, Other (non HMO) | Admitting: Family Medicine

## 2022-11-08 VITALS — BP 137/92 | HR 76 | Ht 70.0 in | Wt 234.6 lb

## 2022-11-08 DIAGNOSIS — G4733 Obstructive sleep apnea (adult) (pediatric): Secondary | ICD-10-CM | POA: Diagnosis not present

## 2022-11-08 DIAGNOSIS — G4719 Other hypersomnia: Secondary | ICD-10-CM

## 2022-11-08 MED ORDER — SUNOSI 150 MG PO TABS: 150.0000 mg | ORAL_TABLET | Freq: Every day | ORAL | 1 refills | Status: DC

## 2022-11-22 ENCOUNTER — Telehealth: Payer: Self-pay | Admitting: Family Medicine

## 2022-11-22 NOTE — Telephone Encounter (Signed)
Called pt and let him know that we will get in contact with the PA team about his medication and see what is going on with his PA,told pt we'll get back in contact with him. Pt verbalized understanding.

## 2022-11-22 NOTE — Telephone Encounter (Signed)
Pt called stated he needs to talk to nurse about medication Solriamfetol HCl (SUNOSI) 150 MG TABS. State he haven't been able to get medication from pharmacy for three weeks now. Pt is requesting a call back from nurse.

## 2022-11-23 NOTE — Telephone Encounter (Signed)
Submitted PA Sunosi urgently on covermymeds. KeyAH:[id number]. Waiting on determination from [place].

## 2022-11-23 NOTE — Telephone Encounter (Signed)
Please call pt and let him know Sunosi approved through insurance. He should now be able to pick this up from his pharmacy.  "BA:[id number];Review Type:Prior Auth;Coverage Start Date:11/23/2022;Coverage End Date:11/23/2023;. Authorization Expiration Date: November 23, 2023."

## 2022-11-24 NOTE — Telephone Encounter (Signed)
Called pt and informed him that [place] approved through insurance. He should now be able to pick this up from his pharmacy. Pt said thank for calling and have a great day.

## 2023-04-28 NOTE — Progress Notes (Signed)
PATIENT: Jonathan Mcfarland DOB: 05-14-1971  REASON FOR VISIT: follow up HISTORY FROM: patient  Chief Complaint  Patient presents with   Follow-up    Pt in room 2. Here for cpap follow up. Pt reports doing well on cpap.      HISTORY OF PRESENT ILLNESS:  05/02/23 ALL: Jonathan Mcfarland returns for follow up for OSA on CAP and EDS on Sunosi. He continues to do well. He is using therapy every night for about 7 hours, on average. He uses old machine at his vacation property at [facility]. He is tolerating therapy well. He denies concerns with machine or supplies.   We increased Sunosi to 150mg  at last visit 6 months ago. He does note improvement in daytime alertness but continues to report having difficulty staying awake if he is sitting still. He typically goes to bed between 9-10p and wakes around 5-6am. Sometimes he feels ready to start the day and some days he is still pretty groggy. Usually 2-3 days a week he feels he needs to go back to sleep. He is now taking Ozempic and reports nocturia is much better. A1C down to 6.7. He does not have a dedicated exercise regimen but is very active at work. Some days he can walk up to 35 miles a day, usually averages 6-8 miles a day.   He denies difficulty driving. He takes a break every 1-2 hours to get out of truck and move around. He has difficulty staying awake during meetings. Unknown family history of sleep disorders. He was adopted. He has failed modafinil and armodafinil.     11/08/2022 ALL: Jonathan Mcfarland returns for follow up for OSA on CPAP and EDS on Sunosi. He reports Sunosi has helped a little better than modafinil and armodafinil. He continues to have some daytime sleepiness. He is sleeping about 8 hours, on average. He goes to bed around 8:30-9pm and wakes around 5am. He denies concerns of insomnia. He is using CPAP nightly for about 7 hours. He denies concerns with machine or supplies. He recently started Ozempic and has been losing weight.  CBGs are significantly improved.     04/19/2022 ALL: Jonathan Mcfarland returns for follow up for OSA on CPAP and EDS on armodafinil 250mg  QD. He feels that he is doing fairly well. He does feel that armodafinil helps but he continues to have difficulty with daytime sleepiness. ESS 17/24. He is able to work full time. He is exposed to heat daily which makes him very tired. He is drinking about a case of water a day. He rarely gets sleepy driving. He carries candy with him just in case. He is working on lowering A1C. Last A1C was around 6.1.     10/19/2021 ALL: Jonathan Mcfarland returns for follow up for OSA on CPAP and EDS. We switched him from modafinil to armodafinil at last visit 03/2021. He does feel that it is helping more so than modafinil. He continues to have daytime sleepiness, especially after lunch. He is trying to cut back on carbs. He is working out of town. Now staying in a camper when working. He admits that he forgets to use his CPAP. He is using his old CPAP when he travels.      04/13/2021 ALL:  Jonathan Mcfarland returns for CPAP follow up. He is doing well on therapy. He has resumed daily and 4 hour usage. He is now back on Humira and psoriasis is well managed. He deneis concerns with rash or sin irritation. He does feel  that velcro straps need to be replaced more frequently and is working with [facility] to address. He is feeling fairly well, today. 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 He feels that modafinil is not as effective as it used to be. He admits that diet changes could be made as he loves carbohydrates. He previously took phentermine for weight loss and felt it was very effective for EDS. He has never tried armodafinil.     01/12/2021 ALL: He returns for follow up for OSA on CPAP. He has had some difficulty with compliance due to a couple of medical conditions over the past few months. He had a cracked tooth a few months ago that was recently pulled. He has psoriasis and reports  that the mask and headgear aggravates his skin. He has tried multiple masks and headgear in the past that he felt were worse. He is happy with his full face mask and headgear. He was on Humira and doing well. Recently, insurance denied. He was previously taking modafinil for EDS. He reports that he has not taken this medication in over a year. Insurance requires that rx come from sleep medicine. He reports that he nods off during the day. He can not sit still for any period of time. BP usually runs 120-130/70-80. Last saw PCP in January, 2022.      01/03/2020 ALL:  Jonathan Mcfarland is a 52 y.o. male here today for follow up for OSA recently restarted on CPAP therapy.  Home sleep study revealed mild to moderate OSA with total AHI of 16.8/h exacerbated by REM sleep with AHI of 39.5/h.  No prolonged hypoxemia.  AutoPap therapy was ordered.  Patient reports that he is doing fair.  He is currently using a DreamWear full face mask and feels that it is much more comfortable than his previous mask.  He denies any difficulty with his CPAP machine.  Compliance report dated 12/03/2019 through 01/01/2020 reveals that he used CPAP 30 of the past 30 days for compliance of 100%.  He used CPAP greater than 4 hours all 30 days for compliance 100%.  Average usage was 8 hours and 6 minutes.  Residual AHI was 1.4 on 5 to 15 cm of water and an EPR of 2.  There was no significant leak noted.   REVIEW OF SYSTEMS: Out of a complete 14 system review of symptoms, the patient complains only of the following symptoms, daytime sleepiness, fatigue and all other reviewed systems are negative.  ESS:18/24, previously 13/24   ALLERGIES: Allergies  Allergen Reactions   Ciprofloxacin Itching   Percocet [Oxycodone-Acetaminophen]     HOME MEDICATIONS: Outpatient Medications Prior to Visit  Medication Sig Dispense Refill   albuterol (VENTOLIN HFA) 108 (90 Base) MCG/ACT inhaler Inhale 1-2 puffs into the lungs every 6 (six) hours as  needed for wheezing or shortness of breath.     HUMIRA PEN 40 MG/0.4ML PNKT Inject 40 mg into the skin every 14 (fourteen) days.     rosuvastatin (CRESTOR) 5 MG tablet 5 mg daily.     Semaglutide, 1 MG/DOSE, (OZEMPIC, 1 MG/DOSE,) 4 MG/3ML SOPN Inject into the skin.     testosterone cypionate (DEPOTESTOSTERONE CYPIONATE) 200 MG/ML injection INJECT 2 ML INTRAMUSCULARLY EVERY TWO WEEKS     Solriamfetol HCl (SUNOSI) 150 MG TABS Take 1 tablet (150 mg total) by mouth daily. 90 tablet 1   FARXIGA 10 MG TABS tablet TAKE 1 TABLET BY MOUTH IN THE MORNING FOR DIABETES  meloxicam (MOBIC) 15 MG tablet Take 15 mg by mouth daily. (Patient not taking: Reported on 05/02/2023)     No facility-administered medications prior to visit.    PAST MEDICAL HISTORY: Past Medical History:  Diagnosis Date   Asthma    Chest pain    Abnormal treadmill, 2012  /  nuclear April 22, 2011 mild anterior ischemia   Diabetes mellitus without complication ([facility])    Diverticulosis    Ejection fraction    EF 55-60%, echo, July, 2012   GERD (gastroesophageal reflux disease)    Hyperlipidemia    Hypertension    OSA on CPAP    Reflux     PAST SURGICAL HISTORY: Past Surgical History:  Procedure Laterality Date   INGUINAL HERNIA REPAIR  11/05/05    FAMILY HISTORY: Family History  Adopted: Yes  Problem Relation Age of Onset   Heart attack Maternal Uncle 46   Cancer Mother    Heart disease Mother     SOCIAL HISTORY: Social History   Socioeconomic History   Marital status: Married    Spouse name: Not on file   Number of children: 2   Years of education: Not on file   Highest education level: Not on file  Occupational History    Employer: [employer]  Tobacco Use   Smoking status: Former   Smokeless tobacco: Current    Types: Chew  Substance and Sexual Activity   Alcohol use: Yes    Comment: occasionally   Drug use: No   Sexual activity: Not on file  Other Topics Concern   Not on file  Social  History Narrative   Not on file   Social Determinants of Health   Financial Resource Strain: Not on file  Food Insecurity: Not on file  Transportation Needs: Not on file  Physical Activity: Not on file  Stress: Not on file  Social Connections: Not on file  Intimate Partner Violence: Not on file      PHYSICAL EXAM  Vitals:   05/02/23 0828 05/02/23 0831  BP: (!) 143/94 130/88  Pulse: 98 91  Weight: 230 lb 3.2 oz (104.4 kg)   Height: 5\' 10"  (1.778 m)         Body mass index is 33.03 kg/m.  Generalized: Well developed, in no acute distress  Cardiology: normal rate and rhythm, no murmur noted Respiratory: clear to auscultation bilaterally  Neurological examination  Mentation: Alert oriented to time, place, history taking. Follows all commands speech and language fluent Cranial nerve II-XII: Pupils were equal round reactive to light. Extraocular movements were full, visual field were full  Motor: The motor testing reveals 5 over 5 strength of all 4 extremities. Good symmetric motor tone is noted throughout.  Gait and station: Gait is normal.   DIAGNOSTIC DATA (LABS, IMAGING, TESTING) - I reviewed patient records, labs, notes, testing and imaging myself where available.      No data to display           Lab Results  Component Value Date   WBC 10.8 (H) 05/10/2011   HGB 14.7 05/10/2011   HCT 44.1 05/10/2011   MCV 83.4 05/10/2011   PLT 228.0 05/10/2011      Component Value Date/Time   NA 138 05/10/2011 1223   K 3.7 05/10/2011 1223   CL 102 05/10/2011 1223   CO2 24 05/10/2011 1223   GLUCOSE 95 05/10/2011 1223   BUN 14 05/10/2011 1223   CREATININE 1.0 05/10/2011 1223   CALCIUM 8.9  05/10/2011 1223   No results found for: "CHOL", "HDL", "LDLCALC", "LDLDIRECT", "TRIG", "CHOLHDL" No results found for: "HGBA1C" No results found for: "VITAMINB12" No results found for: "TSH"     ASSESSMENT AND PLAN 52 y.o. year old male  has a past medical history of  Asthma, Chest pain, Diabetes mellitus without complication ([facility]), Diverticulosis, Ejection fraction, GERD (gastroesophageal reflux disease), Hyperlipidemia, Hypertension, OSA on CPAP, and Reflux. here with     ICD-10-CM   1. OSA on CPAP  G47.33 For home use only DME continuous positive airway pressure (CPAP)    2. Excessive daytime sleepiness  G47.19       Jonathan Mcfarland is doing well on CPAP therapy. Current report shows excellent compliance. He was encouraged to continue using CPAP nightly and for greater than 4 hours each night. He continues to have EDS on Sunosi 150mg  daily. He denies difficulty driving. He will continue to take breaks every 1-2 hours of driving. PDMP is appropriate. Healthy lifestyle habits encouraged with adequate hydration, well-balanced diet and regular exercise. He will continue close follow-up with primary care for comorbidity management.  He will follow-up with Dr Vickey Huger in 6 months to review treatment plan and consider additional testing as appropriate. I look forward to seeing him in follow up, thereafter. He verbalizes understanding and agreement with this plan.   Orders Placed This Encounter  Procedures   For home use only DME continuous positive airway pressure (CPAP)    Order Specific Question:   Length of Need    Answer:   Lifetime    Order Specific Question:   Patient has OSA or probable OSA    Answer:   Yes    Order Specific Question:   Is the patient currently using CPAP in the home    Answer:   Yes    Order Specific Question:   Settings    Answer:   Other see comments    Order Specific Question:   CPAP supplies needed    Answer:   Mask, headgear, cushions, filters, heated tubing and water chamber      Meds ordered this encounter  Medications   Solriamfetol HCl (SUNOSI) 150 MG TABS    Sig: Take 1 tablet (150 mg total) by mouth daily.    Dispense:  90 tablet    Refill:  1    Order Specific Question:   Supervising Provider    Answer:   Anson Fret [id number]      I spent 30 minutes of face-to-face and non-face-to-face time with patient.  This included previsit chart review, lab review, study review, order entry, electronic health record documentation, patient education.   Shawnie Dapper, FNP-C 05/02/2023, 10:22 AM [facility] [address], [address] [place], [place] [address] [phone]

## 2023-04-28 NOTE — Patient Instructions (Addendum)
Please continue using your CPAP regularly. While your insurance requires that you use CPAP at least 4 hours each night on 70% of the nights, I recommend, that you not skip any nights and use it throughout the night if you can. Getting used to CPAP and staying with the treatment long term does take time and patience and discipline. Untreated obstructive sleep apnea when it is moderate to severe can have an adverse impact on cardiovascular health and raise her risk for heart disease, arrhythmias, hypertension, congestive heart failure, stroke and diabetes. Untreated obstructive sleep apnea causes sleep disruption, nonrestorative sleep, and sleep deprivation. This can have an impact on your day to day functioning and cause daytime sleepiness and impairment of cognitive function, memory loss, mood disturbance, and problems focussing. Using CPAP regularly can improve these symptoms.  We will update supply orders, today. Continue Sunosi 150mg  daily.   Follow up with Dr Vickey Huger in 6 months to review treatment plan. May consider additional testing for narcolepsy.

## 2023-05-02 ENCOUNTER — Ambulatory Visit: Payer: Managed Care, Other (non HMO) | Admitting: Family Medicine

## 2023-05-02 ENCOUNTER — Encounter: Payer: Self-pay | Admitting: Family Medicine

## 2023-05-02 VITALS — BP 130/88 | HR 91 | Ht 70.0 in | Wt 230.2 lb

## 2023-05-02 DIAGNOSIS — G4733 Obstructive sleep apnea (adult) (pediatric): Secondary | ICD-10-CM | POA: Diagnosis not present

## 2023-05-02 DIAGNOSIS — G4719 Other hypersomnia: Secondary | ICD-10-CM

## 2023-05-02 MED ORDER — SUNOSI 150 MG PO TABS: 150.0000 mg | ORAL_TABLET | Freq: Every day | ORAL | 1 refills | Status: DC

## 2023-09-30 DIAGNOSIS — R809 Proteinuria, unspecified: Secondary | ICD-10-CM | POA: Diagnosis not present

## 2023-09-30 DIAGNOSIS — Z1331 Encounter for screening for depression: Secondary | ICD-10-CM | POA: Diagnosis not present

## 2023-09-30 DIAGNOSIS — E349 Endocrine disorder, unspecified: Secondary | ICD-10-CM | POA: Diagnosis not present

## 2023-09-30 DIAGNOSIS — E1129 Type 2 diabetes mellitus with other diabetic kidney complication: Secondary | ICD-10-CM | POA: Diagnosis not present

## 2023-09-30 DIAGNOSIS — E7800 Pure hypercholesterolemia, unspecified: Secondary | ICD-10-CM | POA: Diagnosis not present

## 2023-10-12 DIAGNOSIS — R6889 Other general symptoms and signs: Secondary | ICD-10-CM | POA: Diagnosis not present

## 2023-10-12 DIAGNOSIS — J45909 Unspecified asthma, uncomplicated: Secondary | ICD-10-CM | POA: Diagnosis not present

## 2023-10-12 DIAGNOSIS — U071 COVID-19: Secondary | ICD-10-CM | POA: Diagnosis not present

## 2023-10-12 DIAGNOSIS — E1129 Type 2 diabetes mellitus with other diabetic kidney complication: Secondary | ICD-10-CM | POA: Diagnosis not present

## 2023-10-19 ENCOUNTER — Telehealth: Payer: Self-pay | Admitting: Neurology

## 2023-10-19 NOTE — Telephone Encounter (Signed)
LVM and sent mychart msg informing pt of need to reschedule 11/07/23 appt - MD out

## 2023-11-07 ENCOUNTER — Ambulatory Visit: Payer: Managed Care, Other (non HMO) | Admitting: Neurology

## 2023-11-11 ENCOUNTER — Other Ambulatory Visit (HOSPITAL_COMMUNITY): Payer: Self-pay

## 2023-11-11 ENCOUNTER — Telehealth: Payer: Self-pay | Admitting: Pharmacist

## 2023-11-11 NOTE — Telephone Encounter (Signed)
 Prior Authorization form/request asks a question that requires your assistance. Please see the question below and advise accordingly. The PA for [facility] will not be submitted until the necessary information is received.

## 2023-11-12 DIAGNOSIS — G4733 Obstructive sleep apnea (adult) (pediatric): Secondary | ICD-10-CM | POA: Diagnosis not present

## 2023-11-14 ENCOUNTER — Telehealth: Payer: Self-pay | Admitting: Pharmacist

## 2023-11-14 NOTE — Telephone Encounter (Signed)
 PA has been submitted and documented in separate encounter.  Thank you.

## 2023-11-14 NOTE — Telephone Encounter (Signed)
 Pharmacy Patient Advocate Encounter   Received notification from CoverMyMeds that prior authorization for Sunosi 150MG  tablets is required/requested.   Insurance verification completed.   The patient is insured through [facility] .   Per test claim: PA required; PA submitted to above mentioned insurance via CoverMyMeds Key/confirmation #/EOC [facility] Status is pending

## 2023-11-14 NOTE — Telephone Encounter (Signed)
 Jonathan Mcfarland- can you look behind me? I do not see MSLT to answer question

## 2023-11-15 NOTE — Telephone Encounter (Signed)
 Pharmacy Patient Advocate Encounter  Received notification from [facility] that Prior Authorization for Sunosi 150MG  tablets has been APPROVED from 11/13/2023 to 11/12/2024

## 2023-12-05 ENCOUNTER — Telehealth: Payer: Self-pay | Admitting: Neurology

## 2023-12-05 NOTE — Telephone Encounter (Signed)
 LVM and sent mychart msg informing pt of need to reschedule 01/10/24 appt - MD out

## 2023-12-10 DIAGNOSIS — G4733 Obstructive sleep apnea (adult) (pediatric): Secondary | ICD-10-CM | POA: Diagnosis not present

## 2023-12-12 ENCOUNTER — Other Ambulatory Visit: Payer: Self-pay | Admitting: Family Medicine

## 2023-12-12 MED ORDER — SUNOSI 150 MG PO TABS: 150.0000 mg | ORAL_TABLET | Freq: Every day | ORAL | 1 refills | Status: DC

## 2023-12-12 NOTE — Telephone Encounter (Signed)
 Pt has scheduled his needed f/u and is on wait list, pt is requesting a refill of his Solriamfetol HCl (SUNOSI) 150 MG TABS  to [id number]

## 2023-12-12 NOTE — Telephone Encounter (Signed)
 Last seen 05/02/23 by Amy Lomax,NP. Next f/u with Dr. Vickey Huger 03/27/24.  11/07/23, 01/10/24 r/s d/t Dr. Vickey Huger being out.  Last refilled [facility] 08/19/23 #90.  Spoke w/ Amy. She agreed to refill #90, 1 refill.

## 2024-01-10 ENCOUNTER — Ambulatory Visit: Payer: Managed Care, Other (non HMO) | Admitting: Neurology

## 2024-01-10 DIAGNOSIS — G4733 Obstructive sleep apnea (adult) (pediatric): Secondary | ICD-10-CM | POA: Diagnosis not present

## 2024-01-16 NOTE — Progress Notes (Unsigned)
 Jonathan Mcfarland

## 2024-01-17 ENCOUNTER — Ambulatory Visit: Admitting: Neurology

## 2024-01-17 ENCOUNTER — Other Ambulatory Visit: Payer: Self-pay | Admitting: Neurology

## 2024-01-17 ENCOUNTER — Encounter: Payer: Self-pay | Admitting: Neurology

## 2024-01-17 VITALS — BP 122/78 | HR 88 | Ht 70.0 in | Wt 235.2 lb

## 2024-01-17 DIAGNOSIS — G4710 Hypersomnia, unspecified: Secondary | ICD-10-CM | POA: Diagnosis not present

## 2024-01-17 DIAGNOSIS — G4719 Other hypersomnia: Secondary | ICD-10-CM

## 2024-01-17 DIAGNOSIS — G4733 Obstructive sleep apnea (adult) (pediatric): Secondary | ICD-10-CM | POA: Diagnosis not present

## 2024-01-17 MED ORDER — SUNOSI 150 MG PO TABS: 150.0000 mg | ORAL_TABLET | Freq: Every day | ORAL | 1 refills | Status: DC

## 2024-01-17 MED ORDER — AMPHETAMINE-DEXTROAMPHETAMINE 10 MG PO TABS: ORAL_TABLET | ORAL | 0 refills | Status: DC

## 2024-01-17 MED ORDER — AMPHETAMINE-DEXTROAMPHETAMINE 10 MG PO TABS: 10.0000 mg | ORAL_TABLET | Freq: Every day | ORAL | 0 refills | Status: AC | PRN
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
## 2024-01-18 ENCOUNTER — Telehealth: Payer: Self-pay | Admitting: Neurology

## 2024-01-18 NOTE — Telephone Encounter (Signed)
 Pharmacy called asking for the diagnosis code for the medication.  POD 3 provided :Indications of Use: hypersomnia on CPAP (G47.10)

## 2024-01-26 DIAGNOSIS — E7800 Pure hypercholesterolemia, unspecified: Secondary | ICD-10-CM | POA: Diagnosis not present

## 2024-01-26 DIAGNOSIS — Z125 Encounter for screening for malignant neoplasm of prostate: Secondary | ICD-10-CM | POA: Diagnosis not present

## 2024-01-26 DIAGNOSIS — E349 Endocrine disorder, unspecified: Secondary | ICD-10-CM | POA: Diagnosis not present

## 2024-01-26 DIAGNOSIS — R809 Proteinuria, unspecified: Secondary | ICD-10-CM | POA: Diagnosis not present

## 2024-01-26 DIAGNOSIS — Z79899 Other long term (current) drug therapy: Secondary | ICD-10-CM | POA: Diagnosis not present

## 2024-01-26 DIAGNOSIS — E1129 Type 2 diabetes mellitus with other diabetic kidney complication: Secondary | ICD-10-CM | POA: Diagnosis not present

## 2024-01-26 DIAGNOSIS — R5383 Other fatigue: Secondary | ICD-10-CM | POA: Diagnosis not present

## 2024-02-10 DIAGNOSIS — G4733 Obstructive sleep apnea (adult) (pediatric): Secondary | ICD-10-CM | POA: Diagnosis not present

## 2024-03-12 DIAGNOSIS — G4733 Obstructive sleep apnea (adult) (pediatric): Secondary | ICD-10-CM | POA: Diagnosis not present

## 2024-03-26 DIAGNOSIS — Z7985 Long-term (current) use of injectable non-insulin antidiabetic drugs: Secondary | ICD-10-CM | POA: Diagnosis not present

## 2024-03-26 DIAGNOSIS — R109 Unspecified abdominal pain: Secondary | ICD-10-CM | POA: Diagnosis not present

## 2024-03-26 DIAGNOSIS — K4090 Unilateral inguinal hernia, without obstruction or gangrene, not specified as recurrent: Secondary | ICD-10-CM | POA: Diagnosis not present

## 2024-03-26 DIAGNOSIS — N200 Calculus of kidney: Secondary | ICD-10-CM | POA: Diagnosis not present

## 2024-03-26 DIAGNOSIS — R309 Painful micturition, unspecified: Secondary | ICD-10-CM | POA: Diagnosis not present

## 2024-03-26 DIAGNOSIS — N50812 Left testicular pain: Secondary | ICD-10-CM | POA: Diagnosis not present

## 2024-03-26 DIAGNOSIS — E119 Type 2 diabetes mellitus without complications: Secondary | ICD-10-CM | POA: Diagnosis not present

## 2024-03-26 DIAGNOSIS — Z7984 Long term (current) use of oral hypoglycemic drugs: Secondary | ICD-10-CM | POA: Diagnosis not present

## 2024-03-27 ENCOUNTER — Ambulatory Visit: Admitting: Neurology

## 2024-03-27 DIAGNOSIS — N200 Calculus of kidney: Secondary | ICD-10-CM | POA: Diagnosis not present

## 2024-04-11 DIAGNOSIS — G4733 Obstructive sleep apnea (adult) (pediatric): Secondary | ICD-10-CM | POA: Diagnosis not present

## 2024-05-03 DIAGNOSIS — E349 Endocrine disorder, unspecified: Secondary | ICD-10-CM | POA: Diagnosis not present

## 2024-05-03 DIAGNOSIS — E7800 Pure hypercholesterolemia, unspecified: Secondary | ICD-10-CM | POA: Diagnosis not present

## 2024-05-03 DIAGNOSIS — E1129 Type 2 diabetes mellitus with other diabetic kidney complication: Secondary | ICD-10-CM | POA: Diagnosis not present

## 2024-05-03 DIAGNOSIS — R809 Proteinuria, unspecified: Secondary | ICD-10-CM | POA: Diagnosis not present

## 2024-05-11 DIAGNOSIS — G4733 Obstructive sleep apnea (adult) (pediatric): Secondary | ICD-10-CM | POA: Diagnosis not present

## 2024-05-12 DIAGNOSIS — G4733 Obstructive sleep apnea (adult) (pediatric): Secondary | ICD-10-CM | POA: Diagnosis not present

## 2024-08-07 DIAGNOSIS — Z01818 Encounter for other preprocedural examination: Secondary | ICD-10-CM | POA: Diagnosis not present

## 2024-08-10 DIAGNOSIS — Z79899 Other long term (current) drug therapy: Secondary | ICD-10-CM | POA: Diagnosis not present

## 2024-08-10 DIAGNOSIS — E1129 Type 2 diabetes mellitus with other diabetic kidney complication: Secondary | ICD-10-CM | POA: Diagnosis not present

## 2024-08-10 DIAGNOSIS — E7800 Pure hypercholesterolemia, unspecified: Secondary | ICD-10-CM | POA: Diagnosis not present

## 2024-08-10 DIAGNOSIS — G4733 Obstructive sleep apnea (adult) (pediatric): Secondary | ICD-10-CM | POA: Diagnosis not present

## 2024-08-10 DIAGNOSIS — R809 Proteinuria, unspecified: Secondary | ICD-10-CM | POA: Diagnosis not present

## 2024-08-18 DIAGNOSIS — G4733 Obstructive sleep apnea (adult) (pediatric): Secondary | ICD-10-CM | POA: Diagnosis not present

## 2024-10-04 ENCOUNTER — Other Ambulatory Visit: Payer: Self-pay | Admitting: Neurology
